# Patient Record
Sex: Female | Born: 1960 | Race: White | Hispanic: No | Marital: Married | Smoking: Current every day smoker
Health system: Southern US, Community
[De-identification: ages and names within clinical notes are randomized; demographics above are authoritative.]

## PROBLEM LIST (undated history)

## (undated) DIAGNOSIS — M5136 Other intervertebral disc degeneration, lumbar region: Secondary | ICD-10-CM

## (undated) DIAGNOSIS — E785 Hyperlipidemia, unspecified: Secondary | ICD-10-CM

## (undated) DIAGNOSIS — M51369 Other intervertebral disc degeneration, lumbar region without mention of lumbar back pain or lower extremity pain: Secondary | ICD-10-CM

## (undated) DIAGNOSIS — R7303 Prediabetes: Secondary | ICD-10-CM

## (undated) DIAGNOSIS — E876 Hypokalemia: Secondary | ICD-10-CM

## (undated) DIAGNOSIS — I10 Essential (primary) hypertension: Secondary | ICD-10-CM

## (undated) DIAGNOSIS — Q809 Congenital ichthyosis, unspecified: Secondary | ICD-10-CM

## (undated) DIAGNOSIS — G4700 Insomnia, unspecified: Secondary | ICD-10-CM

## (undated) DIAGNOSIS — C4491 Basal cell carcinoma of skin, unspecified: Secondary | ICD-10-CM

## (undated) HISTORY — DX: Congenital ichthyosis, unspecified: Q80.9

## (undated) HISTORY — DX: Other intervertebral disc degeneration, lumbar region: M51.36

## (undated) HISTORY — DX: Insomnia, unspecified: G47.00

## (undated) HISTORY — PX: BREAST SURGERY: SHX581

## (undated) HISTORY — DX: Hypokalemia: E87.6

## (undated) HISTORY — DX: Prediabetes: R73.03

## (undated) HISTORY — DX: Basal cell carcinoma of skin, unspecified: C44.91

## (undated) HISTORY — PX: ABDOMINAL HYSTERECTOMY: SHX81

## (undated) HISTORY — DX: Other intervertebral disc degeneration, lumbar region without mention of lumbar back pain or lower extremity pain: M51.369

## (undated) HISTORY — PX: CHOLECYSTECTOMY: SHX55

## (undated) HISTORY — DX: Essential (primary) hypertension: I10

## (undated) HISTORY — DX: Hyperlipidemia, unspecified: E78.5

---

## 2003-12-12 ENCOUNTER — Ambulatory Visit: Payer: Self-pay | Admitting: Surgery

## 2004-04-28 ENCOUNTER — Emergency Department: Payer: Self-pay | Admitting: Internal Medicine

## 2004-05-28 ENCOUNTER — Ambulatory Visit: Payer: Self-pay | Admitting: Surgery

## 2004-10-31 ENCOUNTER — Inpatient Hospital Stay (HOSPITAL_COMMUNITY): Admission: RE | Admit: 2004-10-31 | Discharge: 2004-11-01 | Payer: Self-pay | Admitting: Gynecology

## 2005-03-09 HISTORY — PX: BREAST EXCISIONAL BIOPSY: SUR124

## 2006-01-21 ENCOUNTER — Ambulatory Visit: Payer: Self-pay | Admitting: Surgery

## 2006-02-02 ENCOUNTER — Ambulatory Visit: Payer: Self-pay | Admitting: Internal Medicine

## 2006-04-21 ENCOUNTER — Ambulatory Visit: Payer: Self-pay | Admitting: Internal Medicine

## 2006-05-18 ENCOUNTER — Ambulatory Visit: Payer: Self-pay | Admitting: Surgery

## 2006-05-24 ENCOUNTER — Ambulatory Visit: Payer: Self-pay | Admitting: Surgery

## 2007-01-26 ENCOUNTER — Ambulatory Visit: Payer: Self-pay | Admitting: Surgery

## 2007-10-05 ENCOUNTER — Ambulatory Visit: Payer: Self-pay | Admitting: Internal Medicine

## 2008-01-30 ENCOUNTER — Ambulatory Visit: Payer: Self-pay | Admitting: Internal Medicine

## 2009-05-23 ENCOUNTER — Ambulatory Visit: Payer: Self-pay | Admitting: Internal Medicine

## 2010-05-27 ENCOUNTER — Ambulatory Visit: Payer: Self-pay | Admitting: Internal Medicine

## 2010-12-26 ENCOUNTER — Other Ambulatory Visit (INDEPENDENT_AMBULATORY_CARE_PROVIDER_SITE_OTHER): Payer: Self-pay

## 2010-12-26 VITALS — BP 128/76 | HR 87 | Ht 66.0 in | Wt 236.0 lb

## 2010-12-26 DIAGNOSIS — Z111 Encounter for screening for respiratory tuberculosis: Secondary | ICD-10-CM

## 2010-12-26 NOTE — Progress Notes (Signed)
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  Her current primary care is unable to get her in for several weeks. TB placed in R forearm.

## 2011-05-28 ENCOUNTER — Ambulatory Visit: Payer: Self-pay | Admitting: Internal Medicine

## 2012-06-28 ENCOUNTER — Ambulatory Visit: Payer: Self-pay | Admitting: Internal Medicine

## 2013-08-30 ENCOUNTER — Ambulatory Visit: Payer: Self-pay | Admitting: Family Medicine

## 2014-07-05 ENCOUNTER — Other Ambulatory Visit: Payer: Self-pay | Admitting: Family Medicine

## 2014-07-05 DIAGNOSIS — Z1231 Encounter for screening mammogram for malignant neoplasm of breast: Secondary | ICD-10-CM

## 2014-09-04 ENCOUNTER — Ambulatory Visit
Admission: RE | Admit: 2014-09-04 | Discharge: 2014-09-04 | Disposition: A | Discharge status: Home / Self Care | Payer: Self-pay | Source: Ambulatory Visit | Attending: Family Medicine | Admitting: Family Medicine

## 2014-09-04 DIAGNOSIS — Z1231 Encounter for screening mammogram for malignant neoplasm of breast: Secondary | ICD-10-CM

## 2016-10-20 ENCOUNTER — Other Ambulatory Visit: Payer: Self-pay | Admitting: Family Medicine

## 2016-10-20 DIAGNOSIS — Z1231 Encounter for screening mammogram for malignant neoplasm of breast: Secondary | ICD-10-CM

## 2016-11-02 ENCOUNTER — Ambulatory Visit: Payer: Self-pay

## 2016-11-10 ENCOUNTER — Ambulatory Visit
Admission: RE | Admit: 2016-11-10 | Discharge: 2016-11-10 | Disposition: A | Discharge status: Home / Self Care | Payer: 59 | Source: Ambulatory Visit | Attending: Family Medicine | Admitting: Family Medicine

## 2016-11-10 DIAGNOSIS — Z1231 Encounter for screening mammogram for malignant neoplasm of breast: Secondary | ICD-10-CM | POA: Insufficient documentation

## 2017-09-14 ENCOUNTER — Emergency Department
Admission: EM | Admit: 2017-09-14 | Discharge: 2017-09-14 | Disposition: A | Discharge status: Home / Self Care | Payer: 59 | Attending: Emergency Medicine | Admitting: Emergency Medicine

## 2017-09-14 ENCOUNTER — Emergency Department: Payer: 59

## 2017-09-14 ENCOUNTER — Encounter: Payer: Self-pay | Admitting: Emergency Medicine

## 2017-09-14 ENCOUNTER — Other Ambulatory Visit: Payer: Self-pay

## 2017-09-14 DIAGNOSIS — R220 Localized swelling, mass and lump, head: Secondary | ICD-10-CM | POA: Diagnosis present

## 2017-09-14 DIAGNOSIS — K047 Periapical abscess without sinus: Secondary | ICD-10-CM

## 2017-09-14 DIAGNOSIS — F17200 Nicotine dependence, unspecified, uncomplicated: Secondary | ICD-10-CM | POA: Insufficient documentation

## 2017-09-14 LAB — CBC WITH DIFFERENTIAL/PLATELET
BASOS ABS: 0 10*3/uL (ref 0–0.1)
BASOS PCT: 0 %
Eosinophils Absolute: 0.1 10*3/uL (ref 0–0.7)
Eosinophils Relative: 1 %
HEMATOCRIT: 42.8 % (ref 35.0–47.0)
HEMOGLOBIN: 15 g/dL (ref 12.0–16.0)
Lymphocytes Relative: 22 %
Lymphs Abs: 2.7 10*3/uL (ref 1.0–3.6)
MCH: 33.2 pg (ref 26.0–34.0)
MCHC: 35.1 g/dL (ref 32.0–36.0)
MCV: 94.8 fL (ref 80.0–100.0)
MONOS PCT: 6 %
Monocytes Absolute: 0.7 10*3/uL (ref 0.2–0.9)
NEUTROS ABS: 8.9 10*3/uL — AB (ref 1.4–6.5)
NEUTROS PCT: 71 %
Platelets: 186 10*3/uL (ref 150–440)
RBC: 4.51 MIL/uL (ref 3.80–5.20)
RDW: 13 % (ref 11.5–14.5)
WBC: 12.4 10*3/uL — ABNORMAL HIGH (ref 3.6–11.0)

## 2017-09-14 LAB — BASIC METABOLIC PANEL
ANION GAP: 9 (ref 5–15)
BUN: 10 mg/dL (ref 6–20)
CHLORIDE: 105 mmol/L (ref 98–111)
CO2: 25 mmol/L (ref 22–32)
Calcium: 9 mg/dL (ref 8.9–10.3)
Creatinine, Ser: 0.88 mg/dL (ref 0.44–1.00)
GFR calc non Af Amer: >60 mL/min (ref >60)
Glucose, Bld: 128 mg/dL — ABNORMAL HIGH (ref 70–99)
Potassium: 3.7 mmol/L (ref 3.5–5.1)
Sodium: 139 mmol/L (ref 135–145)

## 2017-09-14 MED ORDER — CLINDAMYCIN PHOSPHATE 600 MG/50ML IV SOLN
600.0000 mg | Freq: Once | INTRAVENOUS | Status: AC
  Administered 2017-09-14: 600 mg via INTRAVENOUS
  Filled 2017-09-14: qty 50

## 2017-09-14 MED ORDER — IOHEXOL 300 MG/ML  SOLN
75.0000 mL | Freq: Once | INTRAMUSCULAR | Status: AC | PRN
  Administered 2017-09-14: 75 mL via INTRAVENOUS
  Filled 2017-09-14: qty 75

## 2017-09-14 MED ORDER — HYDROCODONE-ACETAMINOPHEN 5-325 MG PO TABS
1.0000 | ORAL_TABLET | Freq: Once | ORAL | Status: AC
  Administered 2017-09-14: 1 via ORAL
  Filled 2017-09-14: qty 1

## 2017-09-14 MED ORDER — KETOROLAC TROMETHAMINE 10 MG PO TABS: 10.0000 mg | ORAL_TABLET | Freq: Four times a day (QID) | ORAL | 0 refills | Status: DC | PRN

## 2017-09-14 MED ORDER — TRAMADOL HCL 50 MG PO TABS: 50.0000 mg | ORAL_TABLET | Freq: Four times a day (QID) | ORAL | 0 refills | Status: AC | PRN

## 2017-09-14 MED ORDER — CLINDAMYCIN HCL 300 MG PO CAPS: 300.0000 mg | ORAL_CAPSULE | Freq: Three times a day (TID) | ORAL | 0 refills | Status: AC

## 2017-09-14 MED ORDER — KETOROLAC TROMETHAMINE 30 MG/ML IJ SOLN
30.0000 mg | Freq: Once | INTRAMUSCULAR | Status: AC
  Administered 2017-09-14: 30 mg via INTRAVENOUS
  Filled 2017-09-14: qty 1

## 2017-09-14 NOTE — Discharge Instructions (Signed)
Follow-up by staff and care from list of dental clinics provided. OPTIONS FOR DENTAL FOLLOW UP CARE  [facility] [place] [email or web address] [phone])  [facility] [phone])  [facility] [phone])  [place] [phone])  [facility] [phone]) This clinic caters to the indigent population and is on a lottery system. Location: [employer] [facility], [place], [place], [place] [facility] Hours: Wednesdays from 6pm - 9pm, patients seen by a lottery system. For dates, call or go to [email or web address] Services: Cleanings, fillings and simple extractions. Payment Options: DENTAL WORK IS FREE OF CHARGE. Bring proof of income or support. Best way to get seen: Arrive at 5:15 pm - this is a lottery, NOT first come/first serve, so arriving earlier will not increase your chances of being seen.     [facility] [phone] Select option 1 for emergencies   Location: [facility], [place], [address], [place] Clinic Hours: No walk-ins accepted - call the day before to schedule an appointment. Check in times are 9:30 am and 1:30 pm. Services: Simple extractions, temporary fillings, pulpectomy/pulp debridement, uncomplicated abscess drainage. Payment Options: PAYMENT IS DUE AT THE TIME OF SERVICE.  Fee is usually $100-200, additional surgical procedures (e.g. abscess drainage) may be extra. Cash, checks, Visa/MasterCard accepted.  Can file Medicaid if patient is covered for dental - patient should call case worker to check. No discount for [facility] patients. Best way to get seen: MUST call the day before and get onto the schedule. Can usually be seen the next 1-2 days. No walk-ins  accepted.     [place] [phone]   Location: [place], [place] [facility] Hours: M, W, Th, F 8am or 1:30pm, Tues 9a or 1:30 - first come/first served. Services: Simple extractions, temporary fillings, uncomplicated abscess drainage.  You do not need to be an [facility] resident. Payment Options: PAYMENT IS DUE AT THE TIME OF SERVICE. Dental insurance, otherwise sliding scale - bring proof of income or support. Depending on income and treatment needed, cost is usually $50-200. Best way to get seen: Arrive early as it is first come/first served.     [facility] [phone]   Location: [facility] Hours: Mon-Thu 8a-5p Services: Most basic dental services including extractions and fillings. Payment Options: PAYMENT IS DUE AT THE TIME OF SERVICE. Sliding scale, up to 50% off - bring proof if income or support. Medicaid with dental option accepted. Best way to get seen: Call to schedule an appointment, can usually be seen within 2 weeks OR they will try to see walk-ins - show up at [place] or 2p (you may have to wait).     [facility] [place] RESIDENTS ONLY   Location: [facility], [place] [address], [place], [place] [address] Clinic Hours: By appointment only. Monday - Thursday 8am-5pm, Friday 8am-12pm Services: Cleanings, fillings, extractions. Payment Options: PAYMENT IS DUE AT THE TIME OF SERVICE. Cash, Visa or MasterCard. Sliding scale - $30 minimum per service. Best way to get seen: Come in to office, complete packet and make an appointment - need proof of income or support monies for each household member and proof of [facility] residence. Usually takes about a month to get in.     [facility] [phone]   Location: [address], [facility] Hours: Walk-in  Urgent Care Dental Services are offered Monday-Friday mornings only. The numbers of emergencies accepted daily is limited to the number of providers available. Maximum 15 - Mondays, Wednesdays & Thursdays Maximum 10 - Tuesdays & Fridays Services: You do not need to be a [facility] resident to be seen for a dental emergency. Emergencies are defined as pain, swelling, abnormal bleeding, or dental trauma. Walkins will receive x-rays if needed. NOTE: Dental cleaning is not an emergency. Payment Options: PAYMENT IS DUE AT THE TIME OF SERVICE. Minimum co-pay is $40.00 for uninsured patients. Minimum co-pay is $3.00 for Medicaid with dental coverage. Dental Insurance is accepted and must be presented at time of visit. Medicare does not cover dental. Forms of payment: Cash, credit card, checks. Best way to get seen: If not previously registered with the clinic, walk-in dental registration begins at 7:15 am and is on a first come/first serve basis. If previously registered with the clinic, call to make an appointment.     [facility] [place] ONLY   Location: [address], [place], [place] Clinic Hours: Mon-Thu 10a-2p Services: Extractions only! Payment Options: FREE (donations accepted) - bring proof of income or support Best way to get seen: Call and schedule an appointment OR come at 8am on the 1st Monday of every month (except for holidays) when it is first come/first served.     [facility] [phone]   Location: [place], [place] Clinic Hours: Friday mornings Services, Payment Options, Best way to get seen: Call for info

## 2017-09-14 NOTE — ED Provider Notes (Signed)
[facility] Emergency Department Provider Note   ____________________________________________   First MD Initiated Contact with Patient 09/14/17 [phone]     (approximate)  I have reviewed the triage vital signs and the nursing notes.   HISTORY  Chief Complaint Oral Swelling    HPI Amanda Fitzgerald is a 57 y.o. female patient presents with left lateral mandible swelling.  Patient on state of complaint was last night.  Patient states she has a fracture to which occurred 3 to 4 months ago.  Patient states pain has increased with the edema.  Patient rates pain as 8/10.  No palliative measures for complaint.  Patient do not have a dentist.  History reviewed. No pertinent past medical history.  There are no active problems to display for this patient.   Past Surgical History:  Procedure Laterality Date  . ABDOMINAL HYSTERECTOMY    . BREAST SURGERY      Prior to Admission medications   Medication Sig Start Date End Date Taking? Authorizing Provider  clindamycin (CLEOCIN) 300 MG capsule Take 1 capsule (300 mg total) by mouth 3 (three) times daily for 10 days. 09/14/17 09/24/17  Sable Feil, PA-C  ketorolac (TORADOL) 10 MG tablet Take 1 tablet (10 mg total) by mouth every 6 (six) hours as needed. 09/14/17   Sable Feil, PA-C  traMADol (ULTRAM) 50 MG tablet Take 1 tablet (50 mg total) by mouth every 6 (six) hours as needed. 09/14/17 09/14/18  Sable Feil, PA-C    Allergies Dilaudid [hydromorphone hcl]  Family History  Problem Relation Age of Onset  . Breast cancer Maternal Aunt 63    Social History Social History   Tobacco Use  . Smoking status: Current Every Day Smoker  Substance Use Topics  . Alcohol use: No  . Drug use: No    Review of Systems Constitutional: No fever/chills Eyes: No visual changes. ENT: No sore throat.  Dental pain Cardiovascular: Denies chest pain. Respiratory: Denies shortness of breath. Gastrointestinal: No  abdominal pain.  No nausea, no vomiting.  No diarrhea.  No constipation. Genitourinary: Negative for dysuria. Musculoskeletal: Negative for back pain. Skin: Negative for rash. Neurological: Negative for headaches, focal weakness or numbness. Allergic/Immunilogical: Dilaudid ____________________________________________   PHYSICAL EXAM:  VITAL SIGNS: ED Triage Vitals  Enc Vitals Group     BP 09/14/17 0852 (!) 184/82     Pulse Rate 09/14/17 0852 100     Resp 09/14/17 0852 20     Temp 09/14/17 0852 98.4 F (36.9 C)     Temp Source 09/14/17 0852 Oral     SpO2 09/14/17 0852 96 %     Weight 09/14/17 0848 235 lb (106.6 kg)     Height 09/14/17 0848 5\' 5"  (1.651 m)     Head Circumference --      Peak Flow --      Pain Score 09/14/17 0848 6     Pain Loc --      Pain Edu? --      Excl. in [place]? --    Constitutional: Alert and oriented. Well appearing and in no acute distress. Mouth/Throat: Mucous membranes are moist.  Oropharynx non-erythematous.  Edematous gingiva and fractured tooth #19. Neck: No stridor. Hematological/Lymphatic/Immunilogical: No cervical lymphadenopathy. Cardiovascular: Normal rate, regular rhythm. Grossly normal heart sounds.  Good peripheral circulation.   Respiratory: Normal respiratory effort.  No retractions. Lungs CTAB. Musculoskeletal: No lower extremity tenderness nor edema.  No joint effusions. Neurologic:  Normal speech and language.  No gross focal neurologic deficits are appreciated. No gait instability. Skin:  Skin is warm, dry and intact. No rash noted.  Facial edema left lateral mandible area Psychiatric: Mood and affect are normal. Speech and behavior are normal.  ____________________________________________   LABS (all labs ordered are listed, but only abnormal results are displayed)  Labs Reviewed  CBC WITH DIFFERENTIAL/PLATELET - Abnormal; Notable for the following components:      Result Value   WBC 12.4 (*)    Neutro Abs 8.9 (*)    All other  components within normal limits  BASIC METABOLIC PANEL - Abnormal; Notable for the following components:   Glucose, Bld 128 (*)    All other components within normal limits   ____________________________________________  EKG   ____________________________________________  RADIOLOGY  ED MD interpretation:    Official radiology report(s): Ct Maxillofacial W Contrast  Result Date: 09/14/2017 CLINICAL DATA:  57 year old female with abscessed tooth and left facial swelling. EXAM: CT MAXILLOFACIAL WITH CONTRAST TECHNIQUE: Multidetector CT imaging of the maxillofacial structures was performed with intravenous contrast. Multiplanar CT image reconstructions were also generated. CONTRAST:  89mL OMNIPAQUE IOHEXOL 300 MG/ML  SOLN COMPARISON:  None. FINDINGS: Osseous: The mandible, maxilla, and zygoma are intact. Chronic appearing mild right nasal bone fracture. Intact skull base and visible calvarium. Intact visible cervical spine. Carious bilateral posterior mandible and maxillary dentition. On the left side prominent dental caries are noted at the residual left mandible molar along with infectious periapical lucency at the roots, more so the posterior root seen on series 3, image 70. Additional prominent dental caries at the residual left mandible wisdom tooth, residual left maxillary molar, residual right mandible molar and right maxillary anterior bicuspid. Orbits: Intact orbital walls.  Normal bilateral orbits soft tissues. Sinuses: Well pneumatized. There is mild mucosal thickening in a right posterior ethmoid air cell. The tympanic cavities and mastoids are clear. Nasal cavity well pneumatized. Soft tissues: Generalized soft tissue swelling, thickening, and stranding about the left buccal space as seen on coronal image 36 of series 6. This includes confluent soft tissue stranding along the body of the left mandible, but no subperiosteal abscess is identified. Associated thickening of the left platysma.  Trace fluid tracking along the left platysma superficial to the left submandibular space, but no organized or drainable fluid collection. The inferior left masticator space is involved, but the sublingual and left parotid spaces are spared. Mild reactive left level 1 B and level 2 lymph nodes are noted with no cystic or necrotic nodes. Negative visible larynx, pharynx, parapharyngeal spaces, retropharyngeal space (retropharyngeal course of both carotids, more so the right), right submandibular space and right parotid space. The major vascular structures in the neck and at the skull base are patent, including the cavernous sinus. Limited intracranial: Negative visualized brain parenchyma. IMPRESSION: 1. Left buccal space and facial cellulitis which is probably odontogenic in origin. However, there is no subperiosteal or soft tissue abscess, and no obvious tooth of origin - although the carious left mandible molar is favored. 2. Reactive left cervical lymph nodes. Electronically Signed   By: Genevie Ann M.D.   On: 09/14/2017 11:23    ____________________________________________   PROCEDURES  Procedure(s) performed: None  Procedures  Critical Care performed: No  ____________________________________________   INITIAL IMPRESSION / ASSESSMENT AND PLAN / ED COURSE  As part of my medical decision making, I reviewed the following data within the electronic MEDICAL RECORD NUMBER    Left facial edema secondary to dontogenic cellulitis.  Discussed  rationale for patient to follow-up with dentist for definitive evaluation and treatment.  Patient given discharge care instruction.  Patient given a prescription for clindamycin, Toradol, and tramadol.      ____________________________________________   FINAL CLINICAL IMPRESSION(S) / ED DIAGNOSES  Final diagnoses:  Dental abscess     ED Discharge Orders        Ordered    clindamycin (CLEOCIN) 300 MG capsule  3 times daily     09/14/17 1138    ketorolac  (TORADOL) 10 MG tablet  Every 6 hours PRN     09/14/17 1138    traMADol (ULTRAM) 50 MG tablet  Every 6 hours PRN     09/14/17 1138       Note:  This document was prepared using Dragon voice recognition software and may include unintentional dictation errors.    Sable Feil, PA-C 09/14/17 1153    Lavonia Drafts, MD 09/14/17 [phone]

## 2017-09-14 NOTE — ED Triage Notes (Signed)
Patient complaining of "abscessed tooth".  Swelling left side of face, states pain started with lower jaw tooth, "part of the tooth broke off months ago".

## 2018-10-14 IMAGING — CT CT MAXILLOFACIAL W/ CM
3 series · 14 of 47 positions shown, 16 images · IV contrast (omnipaque)
Comparison: None.

CLINICAL DATA: 57-year-old female with abscessed tooth and left
facial swelling.

EXAM:
CT MAXILLOFACIAL WITH CONTRAST
TECHNIQUE: Multidetector CT imaging of the maxillofacial structures was
performed with intravenous contrast. Multiplanar CT image
reconstructions were also generated.
CONTRAST:  75mL OMNIPAQUE IOHEXOL 300 MG/ML  SOLN

[Series 2: max soft · axial · 0.36mm/px · z∈[-116,+38]mm · 8 of 91 slices shown, 10 images]
[im 7/91  brain]
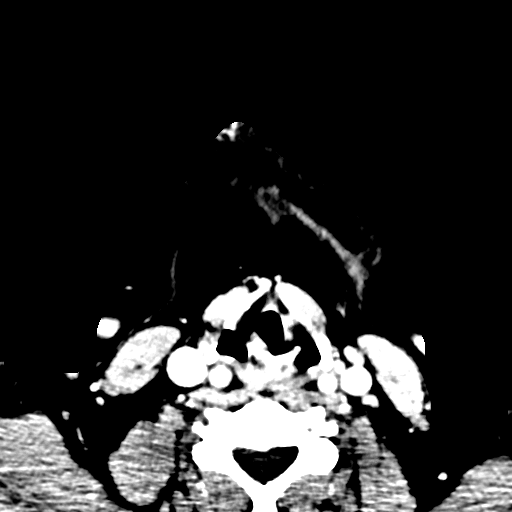
[im 7/91  bone]
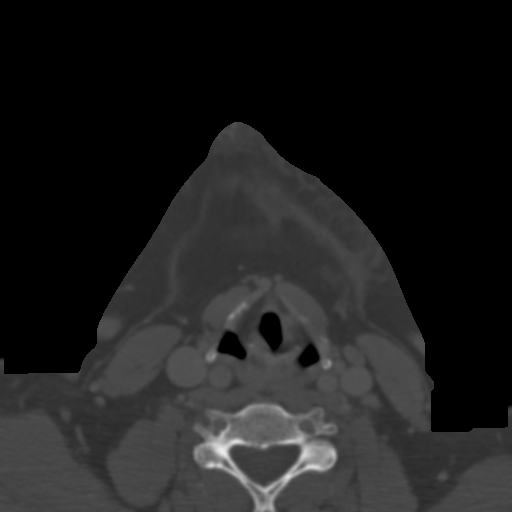
[im 19/91  bone]
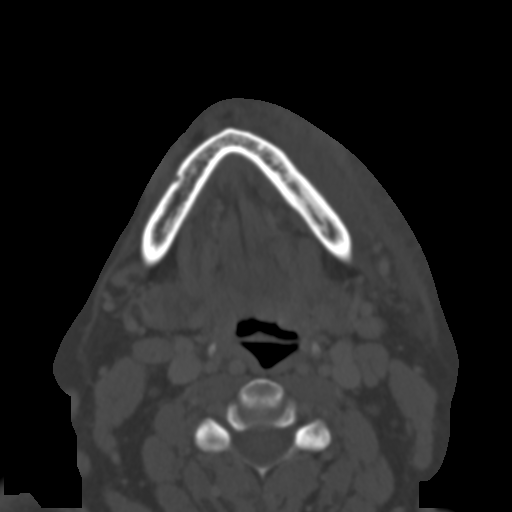
[im 28/91  bone]
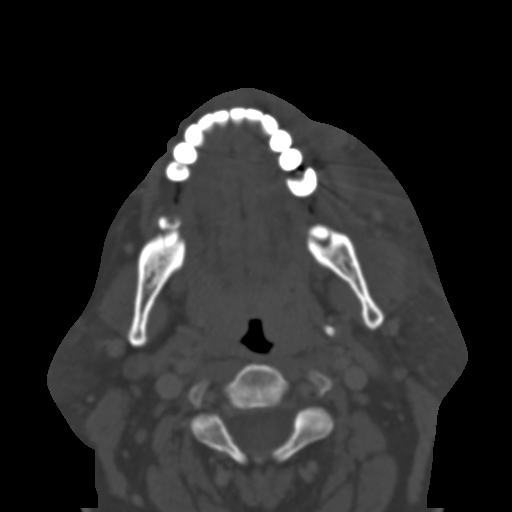
[im 41/91  bone]
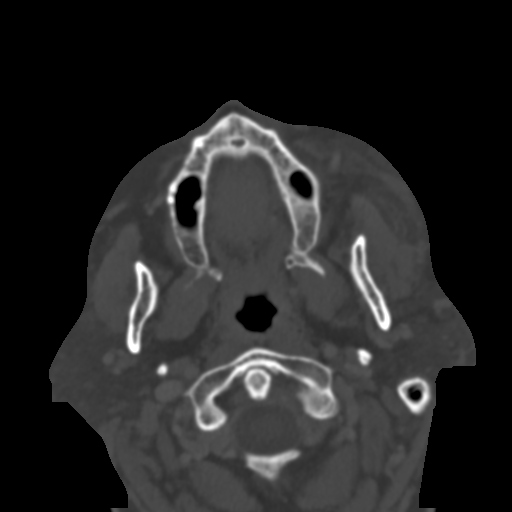
[im 50/91  brain]
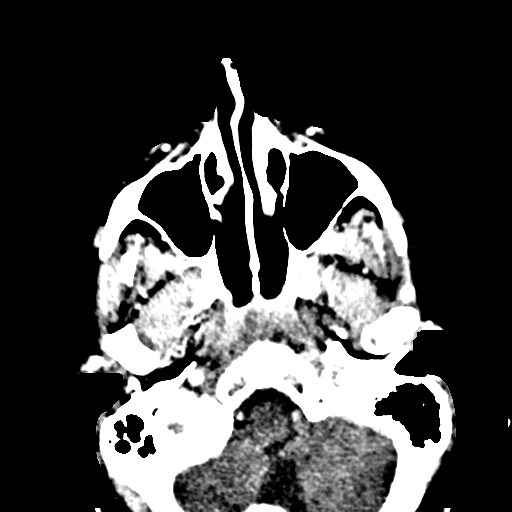
[im 50/91  bone]
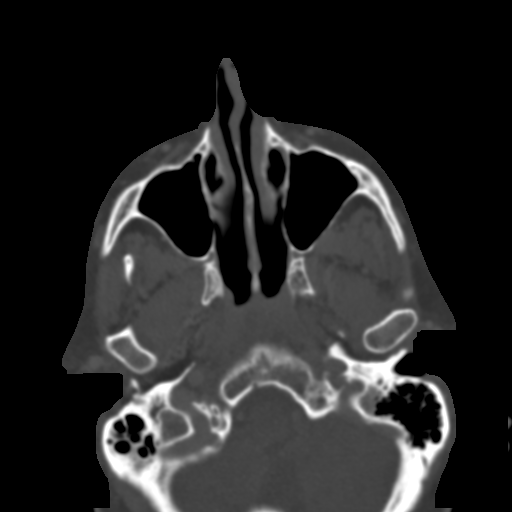
[im 63/91  bone]
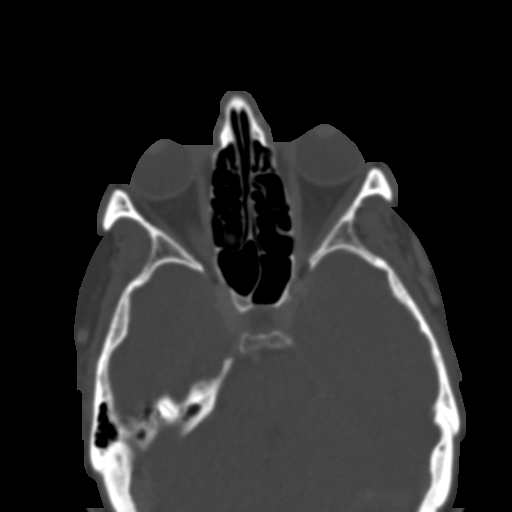
[im 72/91  bone]
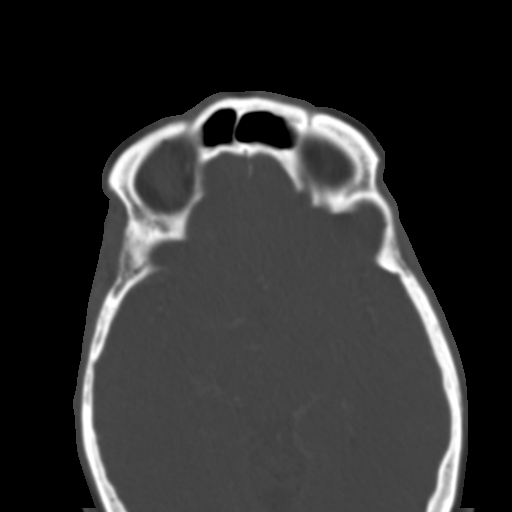
[im 84/91  bone]
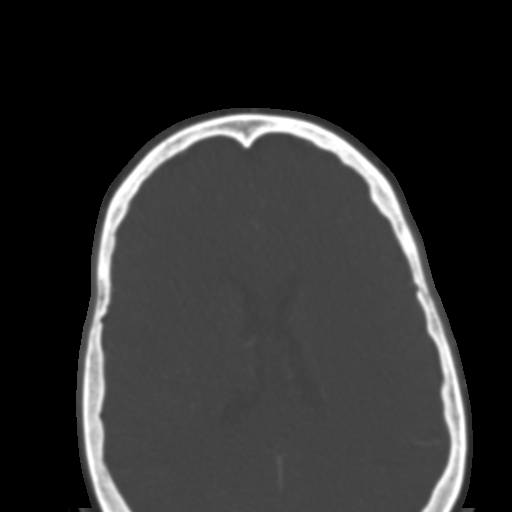

[Series 6: coronal soft · coronal · 0.36mm/px · 3 of 114 slices shown]
[im 38/114  bone]
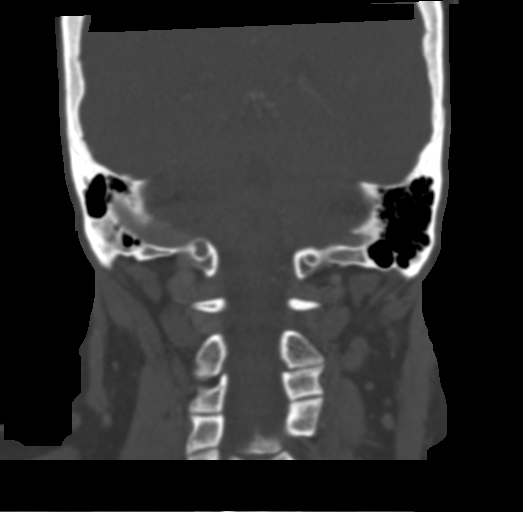
[im 51/114  bone]
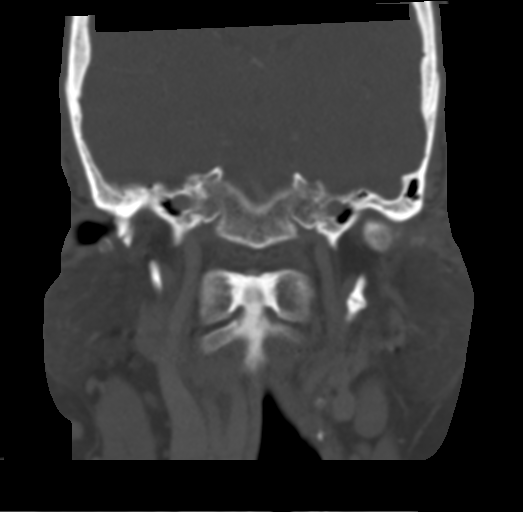
[im 63/114  bone]
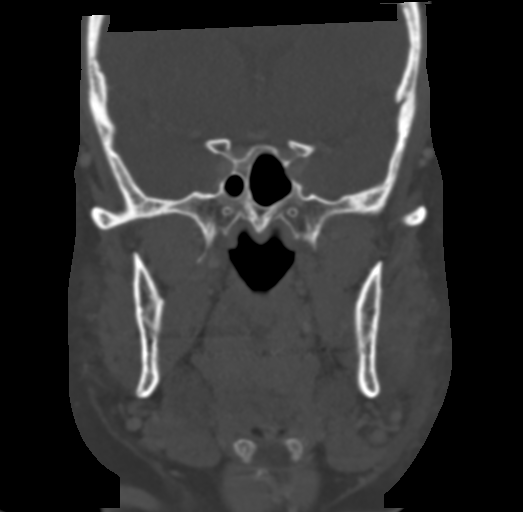

[Series 8: sagittal soft · sagittal · 0.36mm/px · 3 of 92 slices shown]
[im 31/92  bone]
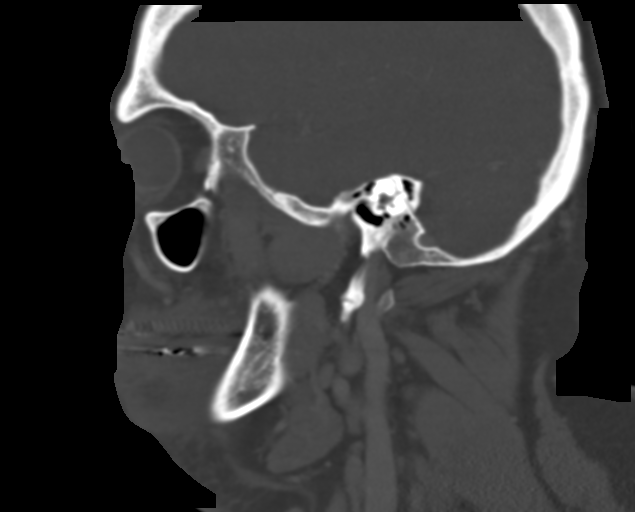
[im 46/92  bone]
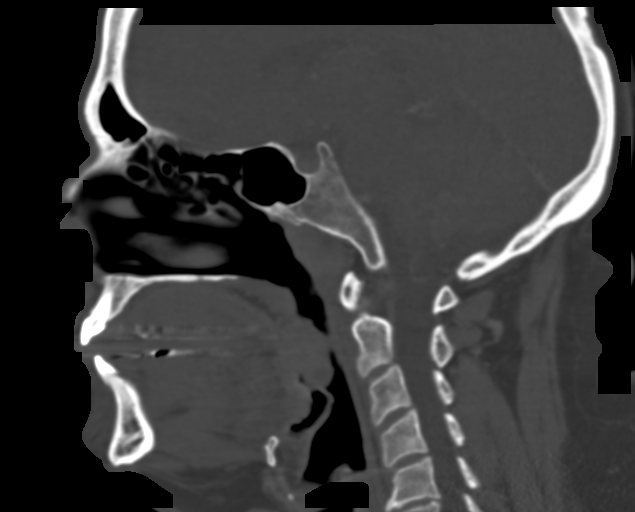
[im 61/92  bone]
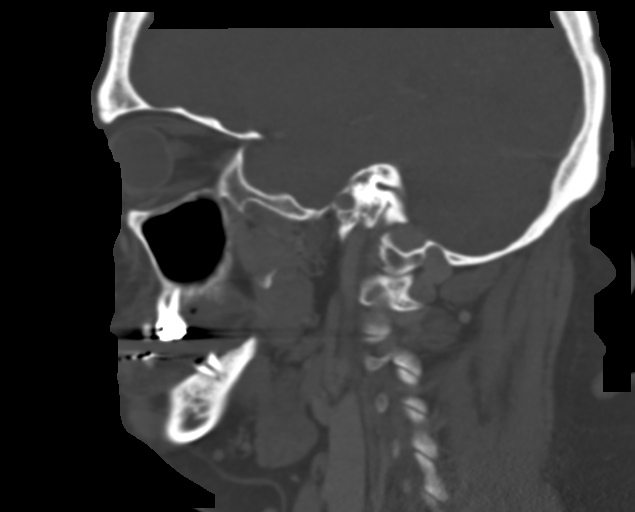

[14 of 47 positions shown; findings below may reference images not displayed]

FINDINGS: Osseous: The mandible, maxilla, and zygoma are intact. Chronic
appearing mild right nasal bone fracture. Intact skull base and
visible calvarium. Intact visible cervical spine.

Carious bilateral posterior mandible and maxillary dentition. On the
left side prominent dental caries are noted at the residual left
mandible molar along with infectious periapical lucency at the
roots, more so the posterior root seen on series 3, image 70.

Additional prominent dental caries at the residual left mandible
wisdom tooth, residual left maxillary molar, residual right mandible
molar and right maxillary anterior bicuspid.

Orbits: Intact orbital walls.  Normal bilateral orbits soft tissues.

Sinuses: Well pneumatized. There is mild mucosal thickening in a
right posterior ethmoid air cell. The tympanic cavities and mastoids
are clear. Nasal cavity well pneumatized.

Soft tissues:

Generalized soft tissue swelling, thickening, and stranding about
the left buccal space as seen on coronal image 36 of series 6. This
includes confluent soft tissue stranding along the body of the left
mandible, but no subperiosteal abscess is identified. Associated
thickening of the left platysma. Trace fluid tracking along the left
platysma superficial to the left submandibular space, but no
organized or drainable fluid collection. The inferior left
masticator space is involved, but the sublingual and left parotid
spaces are spared.

Mild reactive left level 1 B and level 2 lymph nodes are noted with
no cystic or necrotic nodes.

Negative visible larynx, pharynx, parapharyngeal spaces,
retropharyngeal space (retropharyngeal course of both carotids, more
so the right), right submandibular space and right parotid space.
The major vascular structures in the neck and at the skull base are
patent, including the cavernous sinus.

Limited intracranial: Negative visualized brain parenchyma.
IMPRESSION: 1. Left buccal space and facial cellulitis which is probably
odontogenic in origin. However, there is no subperiosteal or soft
tissue abscess, and no obvious tooth of origin - although the
carious left mandible molar is favored.
2. Reactive left cervical lymph nodes.

## 2019-06-02 ENCOUNTER — Other Ambulatory Visit: Payer: Self-pay

## 2019-06-02 ENCOUNTER — Emergency Department
Admission: EM | Admit: 2019-06-02 | Discharge: 2019-06-02 | Disposition: A | Discharge status: Home / Self Care | Payer: Self-pay | Attending: Emergency Medicine | Admitting: Emergency Medicine

## 2019-06-02 ENCOUNTER — Encounter: Payer: Self-pay | Admitting: Emergency Medicine

## 2019-06-02 ENCOUNTER — Emergency Department: Payer: Self-pay

## 2019-06-02 DIAGNOSIS — Y990 Civilian activity done for income or pay: Secondary | ICD-10-CM | POA: Insufficient documentation

## 2019-06-02 DIAGNOSIS — Y9259 Other trade areas as the place of occurrence of the external cause: Secondary | ICD-10-CM | POA: Insufficient documentation

## 2019-06-02 DIAGNOSIS — S39012A Strain of muscle, fascia and tendon of lower back, initial encounter: Secondary | ICD-10-CM | POA: Insufficient documentation

## 2019-06-02 DIAGNOSIS — Y939 Activity, unspecified: Secondary | ICD-10-CM | POA: Insufficient documentation

## 2019-06-02 DIAGNOSIS — F17210 Nicotine dependence, cigarettes, uncomplicated: Secondary | ICD-10-CM | POA: Insufficient documentation

## 2019-06-02 DIAGNOSIS — X500XXA Overexertion from strenuous movement or load, initial encounter: Secondary | ICD-10-CM | POA: Insufficient documentation

## 2019-06-02 LAB — URINALYSIS, COMPLETE (UACMP) WITH MICROSCOPIC
Bacteria, UA: NONE SEEN
Bilirubin Urine: NEGATIVE
Glucose, UA: NEGATIVE mg/dL
Hgb urine dipstick: NEGATIVE
Ketones, ur: NEGATIVE mg/dL
Leukocytes,Ua: NEGATIVE
Nitrite: NEGATIVE
Protein, ur: NEGATIVE mg/dL
Specific Gravity, Urine: 1.011 (ref 1.005–1.030)
pH: 6 (ref 5.0–8.0)

## 2019-06-02 MED ORDER — TRAMADOL HCL 50 MG PO TABS: 50.0000 mg | ORAL_TABLET | Freq: Four times a day (QID) | ORAL | 0 refills | Status: DC | PRN

## 2019-06-02 MED ORDER — METHOCARBAMOL 750 MG PO TABS: 750.0000 mg | ORAL_TABLET | Freq: Four times a day (QID) | ORAL | 0 refills | Status: DC

## 2019-06-02 NOTE — ED Triage Notes (Signed)
Pt here for left lower back pain. Denies known injury.  Pain worse with any movement but especially bending.  No pain when sitting still.  No urinary sx at all per pt..  Ambulatory to triage. Lifts heavy items at work.

## 2019-06-02 NOTE — ED Provider Notes (Signed)
[facility] Emergency Department Provider Note   ____________________________________________   First MD Initiated Contact with Patient 06/02/19 1149     (approximate)  I have reviewed the triage vital signs and the nursing notes.   HISTORY  Chief Complaint Back Pain    HPI Amanda Fitzgerald is a 59 y.o. female patient complain of 4 days of increasing bilateral back pain.  Patient states no specific incident but her job does require repetitive heavy lifting.  Patient denies radicular component to her back pain.  Patient denies bladder or bowel dysfunction.  Patient rates the pain as 8/10.  Patient described pain is "achy".  No palliative measure for complaint.         History reviewed. No pertinent past medical history.  There are no problems to display for this patient.   Past Surgical History:  Procedure Laterality Date  . ABDOMINAL HYSTERECTOMY    . BREAST SURGERY      Prior to Admission medications   Medication Sig Start Date End Date Taking? Authorizing Provider  ketorolac (TORADOL) 10 MG tablet Take 1 tablet (10 mg total) by mouth every 6 (six) hours as needed. 09/14/17   Sable Feil, PA-C  methocarbamol (ROBAXIN-750) 750 MG tablet Take 1 tablet (750 mg total) by mouth 4 (four) times daily. 06/02/19   Sable Feil, PA-C  traMADol (ULTRAM) 50 MG tablet Take 1 tablet (50 mg total) by mouth every 6 (six) hours as needed for moderate pain. 06/02/19   Sable Feil, PA-C    Allergies Dilaudid [hydromorphone hcl]  Family History  Problem Relation Age of Onset  . Breast cancer Maternal Aunt 63    Social History Social History   Tobacco Use  . Smoking status: Current Every Day Smoker  . Smokeless tobacco: Never Used  Substance Use Topics  . Alcohol use: No  . Drug use: No    Review of Systems Constitutional: No fever/chills Eyes: No visual changes. ENT: No sore throat. Cardiovascular: Denies chest pain. Respiratory:  Denies shortness of breath. Gastrointestinal: No abdominal pain.  No nausea, no vomiting.  No diarrhea.  No constipation. Genitourinary: Negative for dysuria. Musculoskeletal: Positive for back pain. Skin: Negative for rash. Neurological: Negative for headaches, focal weakness or numbness. Endocrine:  Hypertension Allergic/Immunilogical: Dilaudid ____________________________________________   PHYSICAL EXAM:  VITAL SIGNS: ED Triage Vitals  Enc Vitals Group     BP 06/02/19 1129 (!) 140/58     Pulse Rate 06/02/19 1129 84     Resp 06/02/19 1129 18     Temp 06/02/19 1129 98.4 F (36.9 C)     Temp Source 06/02/19 1129 Oral     SpO2 06/02/19 1129 97 %     Weight 06/02/19 1139 230 lb (104.3 kg)     Height 06/02/19 1139 5\' 4"  (1.626 m)     Head Circumference --      Peak Flow --      Pain Score 06/02/19 1139 8     Pain Loc --      Pain Edu? --      Excl. in [place]? --    Constitutional: Alert and oriented. Well appearing and in no acute distress. Cardiovascular: Normal rate, regular rhythm. Grossly normal heart sounds.  Good peripheral circulation. Respiratory: Normal respiratory effort.  No retractions. Lungs CTAB. Gastrointestinal: Soft and nontender. No distention. No abdominal bruits.  Right CVA tenderness. Genitourinary: Deferred Musculoskeletal: No obvious lumbar spine deformity.  Patient is moderate guarding palpation of L3-L5.  Patient decreased range of motion with lateral movements.  Patient negative straight leg test.  Neurologic:  Normal speech and language. No gross focal neurologic deficits are appreciated. No gait instability. Skin:  Skin is warm, dry and intact. No rash noted. Psychiatric: Mood and affect are normal. Speech and behavior are normal.  ____________________________________________   LABS (all labs ordered are listed, but only abnormal results are displayed)  Labs Reviewed  URINALYSIS, COMPLETE (UACMP) WITH MICROSCOPIC - Abnormal; Notable for the  following components:      Result Value   Color, Urine YELLOW (*)    APPearance CLEAR (*)    All other components within normal limits   ____________________________________________  EKG   ____________________________________________  RADIOLOGY  ED MD interpretation:    Official radiology report(s): DG Lumbar Spine 2-3 Views  Result Date: 06/02/2019 CLINICAL DATA:  Low back pain for several days, no known injury, initial encounter EXAM: LUMBAR SPINE - 2-3 VIEW COMPARISON:  02/02/2006 FINDINGS: Five lumbar type vertebral bodies are well visualized. Vertebral body height is well maintained. Osteophytic changes are seen. No soft tissue abnormality is noted. Changes of prior cholecystectomy are seen. IMPRESSION: No acute abnormality noted.  Mild degenerative changes seen. Electronically Signed   By: Inez Catalina M.D.   On: 06/02/2019 12:47    ____________________________________________   PROCEDURES  Procedure(s) performed (including Critical Care):  Procedures   ____________________________________________   INITIAL IMPRESSION / ASSESSMENT AND PLAN / ED COURSE  As part of my medical decision making, I reviewed the following data within the [place]     Patient presents with bilateral lower back pain secondary to repetitive heavy lifting.  Physical exam consistent muscle strain.  Discussed negative findings of lumbar spine x-ray and urinalysis.  Patient given discharge care instruction work note.  Patient vies follow-up PCP in 3 days if no improvement.    Amanda Fitzgerald was evaluated in Emergency Department on 06/02/2019 for the symptoms described in the history of present illness. She was evaluated in the context of the global COVID-19 pandemic, which necessitated consideration that the patient might be at risk for infection with the SARS-CoV-2 virus that causes COVID-19. Institutional protocols and algorithms that pertain to the evaluation of patients at  risk for COVID-19 are in a state of rapid change based on information released by regulatory bodies including the CDC and federal and state organizations. These policies and algorithms were followed during the patient's care in the ED.       ____________________________________________   FINAL CLINICAL IMPRESSION(S) / ED DIAGNOSES  Final diagnoses:  Strain of lumbar region, initial encounter     ED Discharge Orders         Ordered    methocarbamol (ROBAXIN-750) 750 MG tablet  4 times daily,   Status:  Discontinued     06/02/19 1345    traMADol (ULTRAM) 50 MG tablet  Every 6 hours PRN,   Status:  Discontinued     06/02/19 1345    methocarbamol (ROBAXIN-750) 750 MG tablet  4 times daily     06/02/19 1346    traMADol (ULTRAM) 50 MG tablet  Every 6 hours PRN     06/02/19 1346           Note:  This document was prepared using Dragon voice recognition software and may include unintentional dictation errors.    Sable Feil, PA-C 06/02/19 1349    Blake Divine, MD 06/03/19 [phone]

## 2019-06-02 NOTE — Discharge Instructions (Signed)
Follow discharge care is take medication as directed.  Be advised medication may cause drowsiness.

## 2020-07-01 IMAGING — CR DG LUMBAR SPINE 2-3V
3 series · 3 of 3 positions shown · non-contrast
Comparison: 02/02/2006

CLINICAL DATA: Low back pain for several days, no known injury,
initial encounter

EXAM:
LUMBAR SPINE - 2-3 VIEW

[l-spine ap]
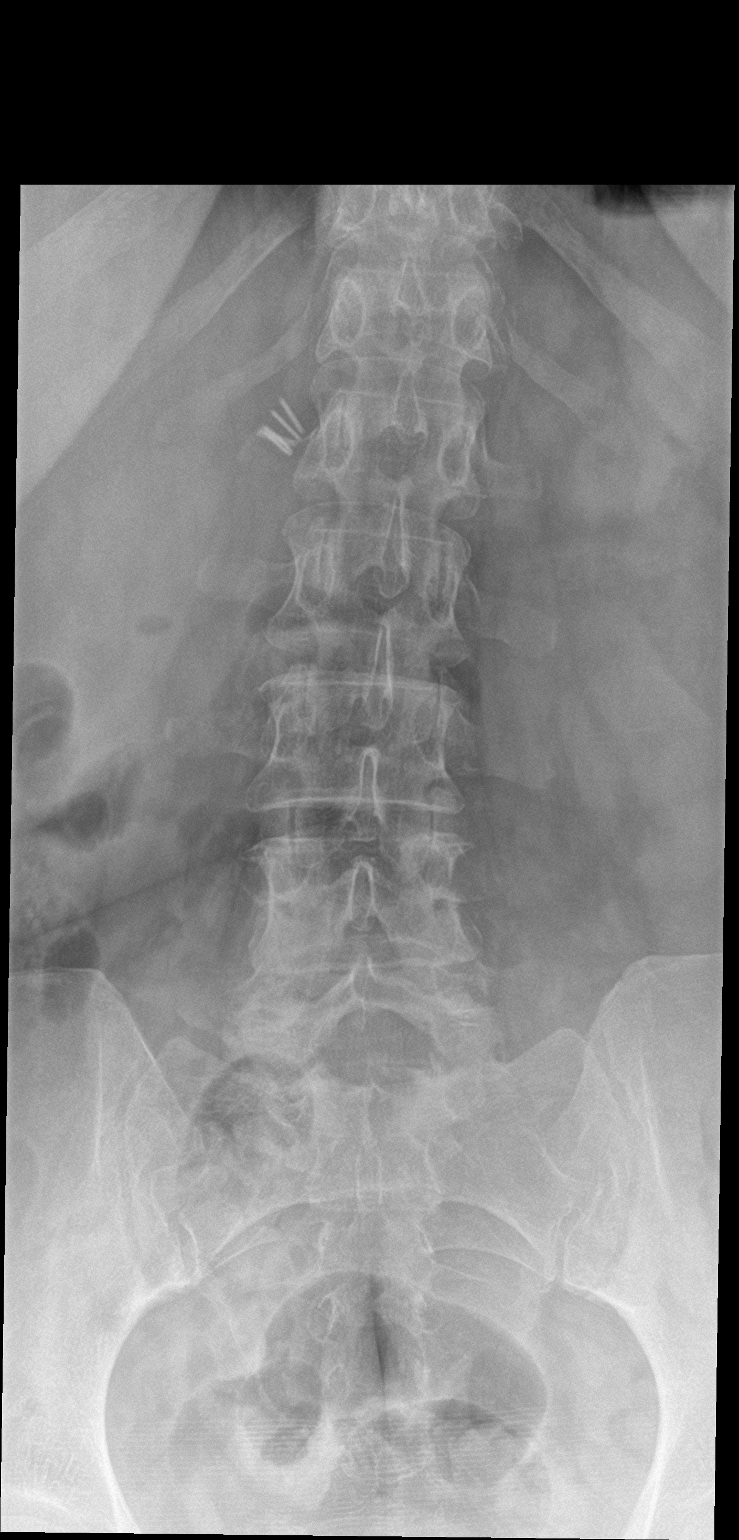

[l-spine spot]
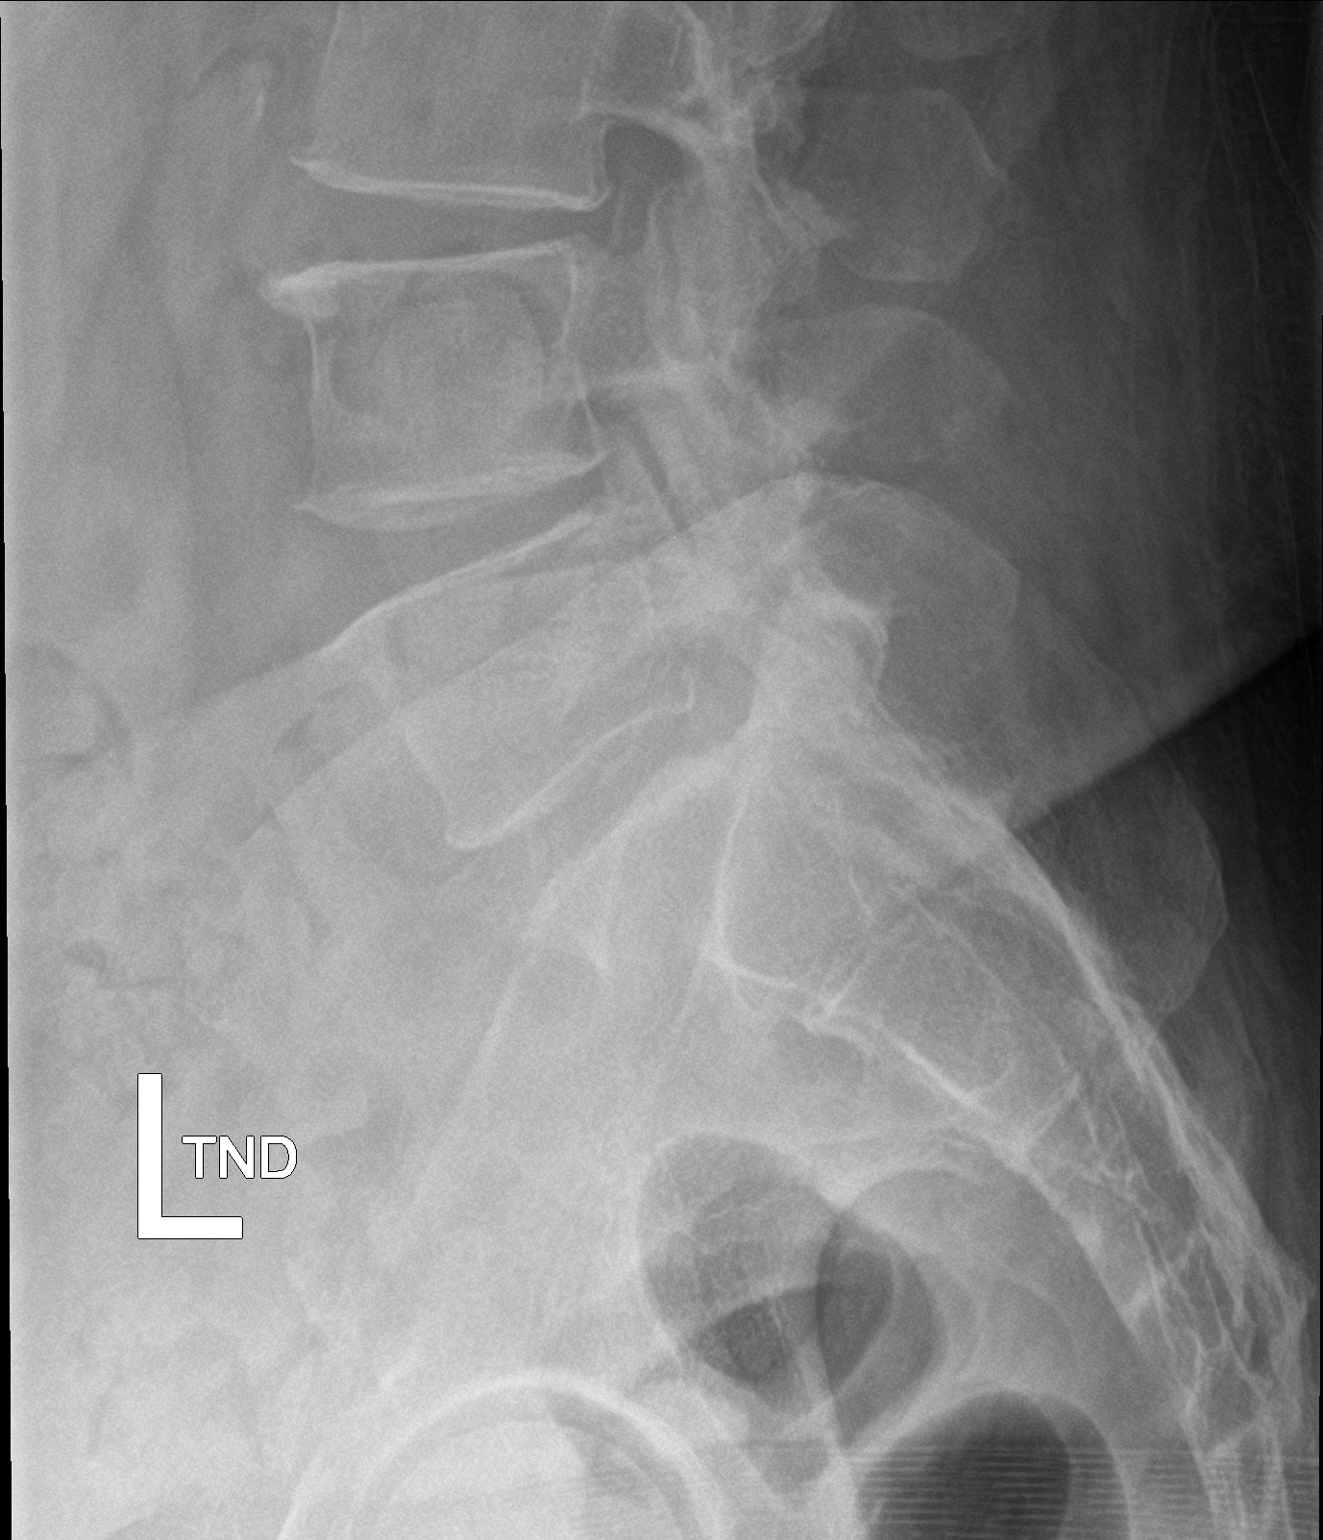

[l-spine lat]
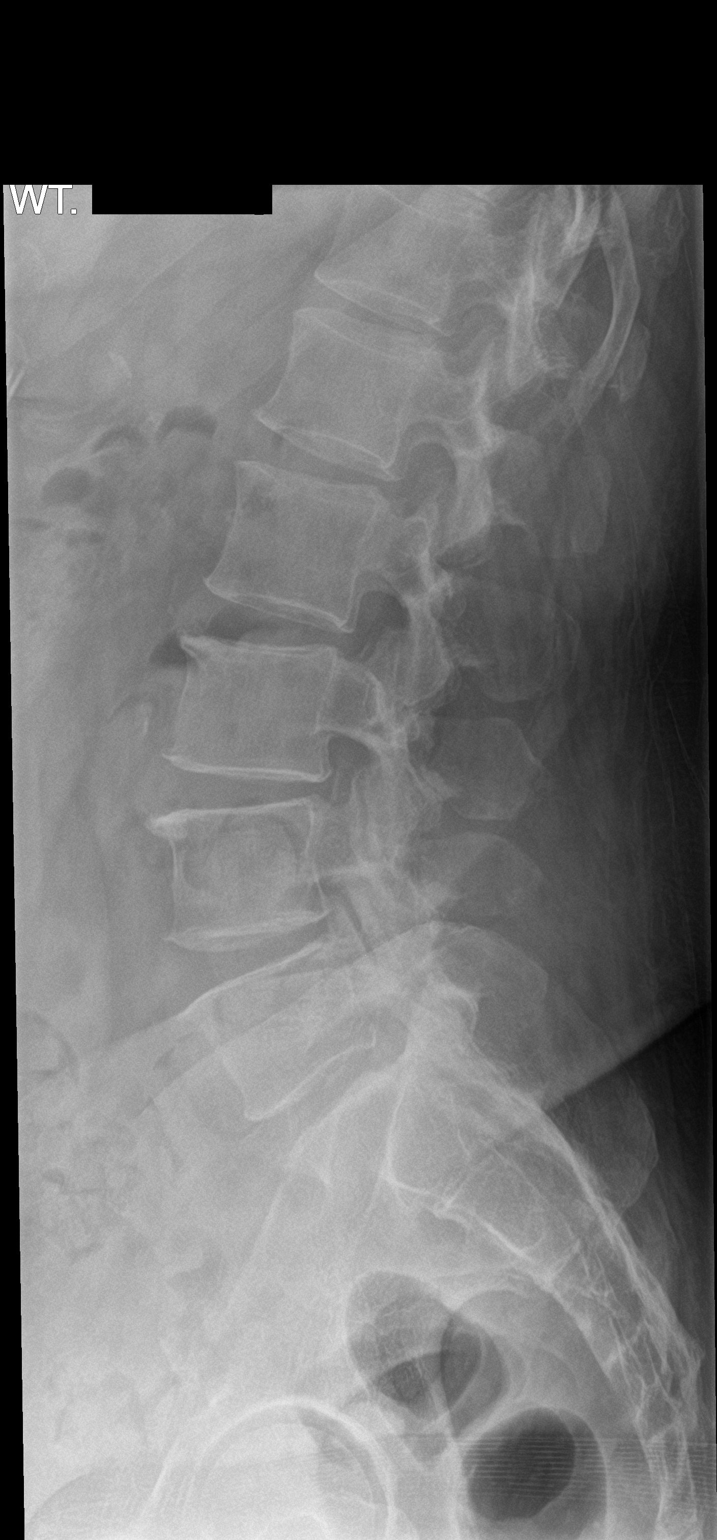

[3 of 3 positions shown; findings below may reference images not displayed]

FINDINGS: Five lumbar type vertebral bodies are well visualized. Vertebral
body height is well maintained. Osteophytic changes are seen. No
soft tissue abnormality is noted. Changes of prior cholecystectomy
are seen.
IMPRESSION: No acute abnormality noted.  Mild degenerative changes seen.

## 2022-04-29 ENCOUNTER — Encounter: Payer: Self-pay | Admitting: Internal Medicine

## 2022-04-29 ENCOUNTER — Ambulatory Visit
Admission: RE | Admit: 2022-04-29 | Discharge: 2022-04-29 | Disposition: A | Discharge status: Home / Self Care | Payer: Medicaid Other | Attending: Internal Medicine | Admitting: Internal Medicine

## 2022-04-29 ENCOUNTER — Inpatient Hospital Stay: Payer: Medicaid Other

## 2022-04-29 ENCOUNTER — Ambulatory Visit
Admission: RE | Admit: 2022-04-29 | Discharge: 2022-04-29 | Disposition: A | Discharge status: Home / Self Care | Payer: Medicaid Other | Source: Ambulatory Visit | Attending: Internal Medicine | Admitting: Internal Medicine

## 2022-04-29 ENCOUNTER — Inpatient Hospital Stay: Payer: Medicaid Other | Attending: Internal Medicine | Admitting: Internal Medicine

## 2022-04-29 ENCOUNTER — Other Ambulatory Visit: Payer: Self-pay | Admitting: Family Medicine

## 2022-04-29 VITALS — BP 195/95 | HR 112 | Temp 97.9°F | Resp 18 | Ht 67.0 in | Wt 247.6 lb

## 2022-04-29 DIAGNOSIS — D472 Monoclonal gammopathy: Secondary | ICD-10-CM | POA: Insufficient documentation

## 2022-04-29 DIAGNOSIS — N61 Mastitis without abscess: Secondary | ICD-10-CM

## 2022-04-29 DIAGNOSIS — Z79899 Other long term (current) drug therapy: Secondary | ICD-10-CM

## 2022-04-29 DIAGNOSIS — N63 Unspecified lump in unspecified breast: Secondary | ICD-10-CM

## 2022-04-29 DIAGNOSIS — F17210 Nicotine dependence, cigarettes, uncomplicated: Secondary | ICD-10-CM | POA: Diagnosis not present

## 2022-04-29 LAB — CBC WITH DIFFERENTIAL/PLATELET
Abs Immature Granulocytes: 0.02 10*3/uL (ref 0.00–0.07)
Basophils Absolute: 0.1 10*3/uL (ref 0.0–0.1)
Basophils Relative: 1 %
Eosinophils Absolute: 0.1 10*3/uL (ref 0.0–0.5)
Eosinophils Relative: 1 %
HCT: 44.2 % (ref 36.0–46.0)
Hemoglobin: 15.6 g/dL — ABNORMAL HIGH (ref 12.0–15.0)
Immature Granulocytes: 0 %
Lymphocytes Relative: 52 %
Lymphs Abs: 4.5 10*3/uL — ABNORMAL HIGH (ref 0.7–4.0)
MCH: 33.3 pg (ref 26.0–34.0)
MCHC: 35.3 g/dL (ref 30.0–36.0)
MCV: 94.2 fL (ref 80.0–100.0)
Monocytes Absolute: 0.5 10*3/uL (ref 0.1–1.0)
Monocytes Relative: 6 %
Neutro Abs: 3.4 10*3/uL (ref 1.7–7.7)
Neutrophils Relative %: 40 %
Platelets: 225 10*3/uL (ref 150–400)
RBC: 4.69 MIL/uL (ref 3.87–5.11)
RDW: 12.4 % (ref 11.5–15.5)
WBC: 8.7 10*3/uL (ref 4.0–10.5)
nRBC: 0 % (ref 0.0–0.2)

## 2022-04-29 LAB — LACTATE DEHYDROGENASE: LDH: 120 U/L (ref 98–192)

## 2022-04-29 LAB — COMPREHENSIVE METABOLIC PANEL
ALT: 27 U/L (ref 0–44)
AST: 30 U/L (ref 15–41)
Albumin: 3.9 g/dL (ref 3.5–5.0)
Alkaline Phosphatase: 96 U/L (ref 38–126)
Anion gap: 10 (ref 5–15)
BUN: 10 mg/dL (ref 6–20)
CO2: 23 mmol/L (ref 22–32)
Calcium: 9.1 mg/dL (ref 8.9–10.3)
Chloride: 103 mmol/L (ref 98–111)
Creatinine, Ser: 0.82 mg/dL (ref 0.44–1.00)
GFR, Estimated: >60 mL/min (ref >60)
Glucose, Bld: 207 mg/dL — ABNORMAL HIGH (ref 70–99)
Potassium: 3.5 mmol/L (ref 3.5–5.1)
Sodium: 136 mmol/L (ref 135–145)
Total Bilirubin: 0.5 mg/dL (ref 0.3–1.2)
Total Protein: 8.3 g/dL — ABNORMAL HIGH (ref 6.5–8.1)

## 2022-04-29 NOTE — Progress Notes (Signed)
New patient evaluation with no acute concerns/problems today.  BP 190/95, HR 112-takes bp med in evening and is nervous.

## 2022-04-29 NOTE — Assessment & Plan Note (Addendum)
#   FEB 2024-incidental PCP]-M protein-1.7 g/dL IgM kappa.  Renal function calcium normal.  No recent hemoglobin data.  # Given the absence of significant abnormality of renal parameter I doubt if patient has any active multiple myeloma.  Clinically suspicious for MGUS.   # Recommend CBC CMP myeloma panel kappa lambda light chain ratio LDH for further evaluation.  # Active smoker-patient is motivated to quit smoking.  Starting Chantix.  Thank you Dr.George for allowing me to participate in the care of your pleasant patient. Please do not hesitate to contact me with questions or concerns in the interim.   # DISPOSITION: # labs ordered today;  # follow up in 2-3 weeks- MD: no labs- Dr.B

## 2022-04-29 NOTE — Progress Notes (Signed)
[facility] CONSULT NOTE  Patient Care Team: Sharyne Peach, MD as PCP - General (Family Medicine)  CHIEF COMPLAINTS/PURPOSE OF CONSULTATION: Monoclonal gammopathy  # FEB 2024-renal function normal calcium normal CBC normal.  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   Oncology History   No history exists.    HISTORY OF PRESENTING ILLNESS: Patient ambulating-independently.  Alone.  Amanda Fitzgerald 62 y.o.  female with longstanding history of smoking is currently referred to [place] for further evaluation recommendations for elevated M protein.  Patient has chronic mild cough.  Denies any worsening shortness of breath.  Denies any hemoptysis.  Denies any tingling or numbness.  Denies any nausea vomiting abdominal pain.   Review of Systems  Constitutional:  Negative for chills, diaphoresis, fever, malaise/fatigue and weight loss.  HENT:  Negative for nosebleeds and sore throat.   Eyes:  Negative for double vision.  Respiratory:  Negative for cough, hemoptysis, sputum production, shortness of breath and wheezing.   Cardiovascular:  Negative for chest pain, palpitations, orthopnea and leg swelling.  Gastrointestinal:  Negative for abdominal pain, blood in stool, constipation, diarrhea, heartburn, melena, nausea and vomiting.  Genitourinary:  Negative for dysuria, frequency and urgency.  Musculoskeletal:  Negative for back pain and joint pain.  Skin: Negative.  Negative for itching and rash.  Neurological:  Negative for dizziness, tingling, focal weakness, weakness and  headaches.  Endo/Heme/Allergies:  Does not bruise/bleed easily.  Psychiatric/Behavioral:  Negative for depression. The patient is not nervous/anxious and does not have insomnia.     MEDICAL HISTORY:  Past Medical History:  Diagnosis Date   BCC (basal cell carcinoma of skin)    Hyperlipidemia    Hypertension    Hypokalemia    Ichthyosis    Insomnia    Lumbar degenerative disc disease    Prediabetes     SURGICAL HISTORY: Past Surgical History:  Procedure Laterality Date   ABDOMINAL HYSTERECTOMY     BREAST EXCISIONAL BIOPSY Bilateral 2007   BREAST SURGERY     CESAREAN SECTION  11/2021   CHOLECYSTECTOMY      SOCIAL HISTORY: Social History   Socioeconomic History   Marital status: Married    Spouse name: Not on file   Number of children: Not on file   Years of education: Not on file   Highest education level: Not on file  Occupational History   Not on file  Tobacco Use   Smoking status: Every Day    Packs/day: 0.25    Years: 45.00    Total pack years: 11.25    Types: Cigarettes   Smokeless tobacco: Never  Substance and Sexual Activity   Alcohol use: No   Drug use: No   Sexual activity: Not on file  Other Topics Concern   Not on file  Social History Narrative   Not on file   Social Determinants of Health   Financial Resource Strain: Not on file  Food Insecurity:  No Food Insecurity (04/29/2022)   Hunger Vital Sign    Worried About Running Out of Food in the Last Year: Never true    Ran Out of Food in the Last Year: Never true  Transportation Needs: No Transportation Needs (04/29/2022)   PRAPARE - Hydrologist (Medical): No    Lack of Transportation (Non-Medical): No  Physical Activity: Not on file  Stress: Not on file  Social Connections: Not on file  Intimate Partner Violence: Not At Risk (04/29/2022)   Humiliation, Afraid, Rape, and Kick questionnaire    Fear of Current or Ex-Partner: No    Emotionally Abused: No     Physically Abused: No    Sexually Abused: No    FAMILY HISTORY: Family History  Problem Relation Age of Onset   Melanoma Mother    Lung cancer Mother    Leukemia Brother    Breast cancer Maternal Aunt 63    ALLERGIES:  is allergic to dilaudid [hydromorphone hcl], hydromorphone, and lisinopril.  MEDICATIONS:  Current Outpatient Medications  Medication Sig Dispense Refill   albuterol (VENTOLIN HFA) 108 (90 Base) MCG/ACT inhaler Inhale 2 puffs into the lungs every 4 (four) hours as needed.     amLODipine (NORVASC) 10 MG tablet Take 1 tablet by mouth daily.     cyclobenzaprine (FLEXERIL) 10 MG tablet Take 1 tablet by mouth 3 (three) times daily as needed.     meloxicam (MOBIC) 15 MG tablet Take 15 mg by mouth daily as needed.     metoprolol tartrate (LOPRESSOR) 25 MG tablet Take 1 tablet by mouth 2 (two) times daily.     Varenicline Tartrate, Starter, 0.5 MG X 11 & 1 MG X 42 TBPK See admin instructions.     ketorolac (TORADOL) 10 MG tablet Take 1 tablet (10 mg total) by mouth every 6 (six) hours as needed. (Patient not taking: Reported on 04/29/2022) 20 tablet 0   methocarbamol (ROBAXIN-750) 750 MG tablet Take 1 tablet (750 mg total) by mouth 4 (four) times daily. (Patient not taking: Reported on 04/29/2022) 20 tablet 0   traMADol (ULTRAM) 50 MG tablet Take 1 tablet (50 mg total) by mouth every 6 (six) hours as needed for moderate pain. (Patient not taking: Reported on 04/29/2022) 12 tablet 0   No current facility-administered medications for this visit.    PHYSICAL EXAMINATION:   Vitals:   04/29/22 1400  BP: (!) 195/95  Pulse: (!) 112  Resp: 18  Temp: 97.9 F (36.6 C)   Filed Weights   04/29/22 1400  Weight: 247 lb 9.6 oz (112.3 kg)    Physical Exam Vitals and nursing note reviewed.  HENT:     Head: Normocephalic and atraumatic.     Mouth/Throat:     Pharynx: Oropharynx is clear.  Eyes:     Extraocular Movements: Extraocular movements intact.     Pupils: Pupils are  equal, round, and reactive to light.  Cardiovascular:     Rate and Rhythm: Normal rate and regular rhythm.  Pulmonary:     Comments: Decreased breath sounds bilaterally.  Abdominal:     Palpations: Abdomen is soft.  Musculoskeletal:        General: Normal range of motion.     Cervical back: Normal range of motion.  Skin:    General: Skin is warm.  Neurological:     General: No focal deficit present.     Mental Status: She is alert and oriented to person, place, and time.  Psychiatric:        Behavior: Behavior normal.        Judgment: Judgment normal.     LABORATORY DATA:  I have reviewed the data as listed Lab Results  Component Value Date   WBC 8.7 04/29/2022   HGB 15.6 (H) 04/29/2022   HCT 44.2 04/29/2022   MCV 94.2 04/29/2022   PLT 225 04/29/2022   Recent Labs    04/29/22 1446  NA 136  K 3.5  CL 103  CO2 23  GLUCOSE 207*  BUN 10  CREATININE 0.82  CALCIUM 9.1  GFRNONAA >60  PROT 8.3*  ALBUMIN 3.9  AST 30  ALT 27  ALKPHOS 96  BILITOT 0.5    RADIOGRAPHIC STUDIES: I have personally reviewed the radiological images as listed and agreed with the findings in the report. No results found.   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  Renal function calcium normal.  No recent hemoglobin data.  # Given the absence of significant abnormality of renal parameter I doubt if patient has any active multiple myeloma.  Clinically suspicious for MGUS.   # Recommend CBC CMP myeloma panel kappa lambda light chain ratio LDH for further evaluation.  # Active smoker-patient is motivated to quit smoking.  Starting Chantix.  Thank you Dr.George for allowing me to participate in the care of your pleasant patient. Please do not hesitate to contact me with questions or concerns in the interim.   # DISPOSITION: # labs ordered today;  # follow up in 2-3 weeks- MD: no labs- Dr.B Above plan of care was discussed with patient/family in  detail.  My contact information was given to the patient/family.     Cammie Sickle, MD 04/29/2022 3:52 PM

## 2022-04-30 ENCOUNTER — Inpatient Hospital Stay
Admission: RE | Admit: 2022-04-30 | Discharge: 2022-04-30 | Disposition: A | Discharge status: Home / Self Care | Payer: Self-pay | Source: Ambulatory Visit | Attending: *Deleted | Admitting: *Deleted

## 2022-04-30 ENCOUNTER — Other Ambulatory Visit: Payer: Self-pay | Admitting: *Deleted

## 2022-04-30 DIAGNOSIS — Z1231 Encounter for screening mammogram for malignant neoplasm of breast: Secondary | ICD-10-CM

## 2022-04-30 LAB — KAPPA/LAMBDA LIGHT CHAINS
Kappa free light chain: 83.7 mg/L — ABNORMAL HIGH (ref 3.3–19.4)
Kappa, lambda light chain ratio: 4.68 — ABNORMAL HIGH (ref 0.26–1.65)
Lambda free light chains: 17.9 mg/L (ref 5.7–26.3)

## 2022-05-05 LAB — MULTIPLE MYELOMA PANEL, SERUM
Albumin SerPl Elph-Mcnc: 3.9 g/dL (ref 2.9–4.4)
Albumin/Glob SerPl: 1 (ref 0.7–1.7)
Alpha 1: 0.2 g/dL (ref 0.0–0.4)
Alpha2 Glob SerPl Elph-Mcnc: 0.8 g/dL (ref 0.4–1.0)
B-Globulin SerPl Elph-Mcnc: 1.2 g/dL (ref 0.7–1.3)
Gamma Glob SerPl Elph-Mcnc: 1.7 g/dL (ref 0.4–1.8)
Globulin, Total: 4 g/dL — ABNORMAL HIGH (ref 2.2–3.9)
IgA: 1488 mg/dL — ABNORMAL HIGH (ref 87–352)
IgG (Immunoglobin G), Serum: 1093 mg/dL (ref 586–1602)
IgM (Immunoglobulin M), Srm: 50 mg/dL (ref 26–217)
M Protein SerPl Elph-Mcnc: 0.7 g/dL — ABNORMAL HIGH
Total Protein ELP: 7.9 g/dL (ref 6.0–8.5)

## 2022-05-08 ENCOUNTER — Ambulatory Visit
Admission: RE | Admit: 2022-05-08 | Discharge: 2022-05-08 | Disposition: A | Discharge status: Home / Self Care | Payer: Medicaid Other | Source: Ambulatory Visit | Attending: Family Medicine | Admitting: Family Medicine

## 2022-05-08 DIAGNOSIS — N61 Mastitis without abscess: Secondary | ICD-10-CM | POA: Diagnosis present

## 2022-05-08 DIAGNOSIS — N63 Unspecified lump in unspecified breast: Secondary | ICD-10-CM

## 2022-05-15 ENCOUNTER — Encounter: Payer: Self-pay | Admitting: Internal Medicine

## 2022-05-15 ENCOUNTER — Inpatient Hospital Stay: Payer: Medicaid Other | Attending: Internal Medicine | Admitting: Internal Medicine

## 2022-05-15 VITALS — BP 158/85 | HR 87 | Temp 96.7°F | Resp 18 | Wt 251.1 lb

## 2022-05-15 DIAGNOSIS — D472 Monoclonal gammopathy: Secondary | ICD-10-CM | POA: Diagnosis present

## 2022-05-15 DIAGNOSIS — Z79899 Other long term (current) drug therapy: Secondary | ICD-10-CM | POA: Diagnosis not present

## 2022-05-15 DIAGNOSIS — F17210 Nicotine dependence, cigarettes, uncomplicated: Secondary | ICD-10-CM | POA: Insufficient documentation

## 2022-05-15 NOTE — Progress Notes (Signed)
[facility] CONSULT NOTE  Patient Care Team: Sharyne Peach, MD as PCP - General (Family Medicine)  CHIEF COMPLAINTS/PURPOSE OF CONSULTATION: Monoclonal gammopathy  # FEB 2024-renal function normal calcium normal CBC normal.  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   Oncology History   No history exists.    HISTORY OF PRESENTING ILLNESS: Patient ambulating-independently.  With family.  Amanda Fitzgerald 62 y.o.  female with longstanding history of smoking i is here to follow-up review results of blood work/x-rays ordered for elevated M protein.  Patient in the process of quitting smoking.  She is on Chantix.   Patient has chronic mild cough.  Denies any worsening shortness of breath. .  Denies any tingling or numbness.    Review of Systems  Constitutional:  Negative for chills, diaphoresis, fever, malaise/fatigue and weight loss.  HENT:  Negative for nosebleeds and sore throat.   Eyes:  Negative for double vision.  Respiratory:  Negative for cough, hemoptysis, sputum production, shortness of breath and wheezing.   Cardiovascular:  Negative for chest pain, palpitations, orthopnea and leg swelling.  Gastrointestinal:  Negative for abdominal pain, blood in stool, constipation, diarrhea, heartburn, melena, nausea and vomiting.  Genitourinary:  Negative for dysuria, frequency and urgency.  Musculoskeletal:  Negative for back pain and joint pain.  Skin: Negative.  Negative for itching and rash.  Neurological:  Negative for dizziness, tingling, focal weakness,  weakness and headaches.  Endo/Heme/Allergies:  Does not bruise/bleed easily.  Psychiatric/Behavioral:  Negative for depression. The patient is not nervous/anxious and does not have insomnia.     MEDICAL HISTORY:  Past Medical History:  Diagnosis Date   BCC (basal cell carcinoma of skin)    Hyperlipidemia    Hypertension    Hypokalemia    Ichthyosis    Insomnia    Lumbar degenerative disc disease    Prediabetes     SURGICAL HISTORY: Past Surgical History:  Procedure Laterality Date   ABDOMINAL HYSTERECTOMY     BREAST EXCISIONAL BIOPSY Right 2007   BREAST SURGERY     CESAREAN SECTION  11/2021   CHOLECYSTECTOMY      SOCIAL HISTORY: Social History   Socioeconomic History   Marital status: Married    Spouse name: Not on file   Number of children: Not on file   Years of education: Not on file   Highest education level: Not on file  Occupational History   Not on file  Tobacco Use   Smoking status: Every Day    Packs/day: 0.25    Years: 45.00    Total pack years: 11.25    Types: Cigarettes   Smokeless tobacco: Never  Substance and Sexual Activity   Alcohol use: No   Drug use: No   Sexual activity: Not on file  Other Topics Concern   Not on file  Social History Narrative   Not on file   Social Determinants of Health   Financial  Resource Strain: Not on file  Food Insecurity: No Food Insecurity (04/29/2022)   Hunger Vital Sign    Worried About Running Out of Food in the Last Year: Never true    Ran Out of Food in the Last Year: Never true  Transportation Needs: No Transportation Needs (04/29/2022)   PRAPARE - Hydrologist (Medical): No    Lack of Transportation (Non-Medical): No  Physical Activity: Not on file  Stress: Not on file  Social Connections: Not on file  Intimate Partner Violence: Not At Risk (04/29/2022)   Humiliation, Afraid, Rape, and Kick questionnaire    Fear of Current or Ex-Partner: No    Emotionally Abused: No     Physically Abused: No    Sexually Abused: No    FAMILY HISTORY: Family History  Problem Relation Age of Onset   Melanoma Mother    Lung cancer Mother    Leukemia Brother    Breast cancer Maternal Aunt 63    ALLERGIES:  is allergic to dilaudid [hydromorphone hcl], hydromorphone, and lisinopril.  MEDICATIONS:  Current Outpatient Medications  Medication Sig Dispense Refill   albuterol (VENTOLIN HFA) 108 (90 Base) MCG/ACT inhaler Inhale 2 puffs into the lungs every 4 (four) hours as needed.     amLODipine (NORVASC) 10 MG tablet Take 1 tablet by mouth daily.     cyclobenzaprine (FLEXERIL) 10 MG tablet Take 1 tablet by mouth 3 (three) times daily as needed.     meloxicam (MOBIC) 15 MG tablet Take 15 mg by mouth daily as needed.     metoprolol tartrate (LOPRESSOR) 25 MG tablet Take 1 tablet by mouth 2 (two) times daily.     Varenicline Tartrate, Starter, 0.5 MG X 11 & 1 MG X 42 TBPK See admin instructions.     ketorolac (TORADOL) 10 MG tablet Take 1 tablet (10 mg total) by mouth every 6 (six) hours as needed. (Patient not taking: Reported on 04/29/2022) 20 tablet 0   methocarbamol (ROBAXIN-750) 750 MG tablet Take 1 tablet (750 mg total) by mouth 4 (four) times daily. (Patient not taking: Reported on 04/29/2022) 20 tablet 0   traMADol (ULTRAM) 50 MG tablet Take 1 tablet (50 mg total) by mouth every 6 (six) hours as needed for moderate pain. (Patient not taking: Reported on 04/29/2022) 12 tablet 0   No current facility-administered medications for this visit.    PHYSICAL EXAMINATION:   Vitals:   05/15/22 0900  BP: (!) 158/85  Pulse: 87  Resp: 18  Temp: (!) 96.7 F (35.9 C)   Filed Weights   05/15/22 0900  Weight: 251 lb 1.6 oz (113.9 kg)    Physical Exam Vitals and nursing note reviewed.  HENT:     Head: Normocephalic and atraumatic.     Mouth/Throat:     Pharynx: Oropharynx is clear.  Eyes:     Extraocular Movements: Extraocular movements intact.     Pupils: Pupils are  equal, round, and reactive to light.  Cardiovascular:     Rate and Rhythm: Normal rate and regular rhythm.  Pulmonary:     Comments: Decreased breath sounds bilaterally.  Abdominal:     Palpations: Abdomen is soft.  Musculoskeletal:        General: Normal range of motion.     Cervical back: Normal range of motion.  Skin:    General: Skin is warm.  Neurological:     General: No focal deficit present.     Mental Status: She is  alert and oriented to person, place, and time.  Psychiatric:        Behavior: Behavior normal.        Judgment: Judgment normal.     LABORATORY DATA:  I have reviewed the data as listed Lab Results  Component Value Date   WBC 8.7 04/29/2022   HGB 15.6 (H) 04/29/2022   HCT 44.2 04/29/2022   MCV 94.2 04/29/2022   PLT 225 04/29/2022   Recent Labs    04/29/22 1446  NA 136  K 3.5  CL 103  CO2 23  GLUCOSE 207*  BUN 10  CREATININE 0.82  CALCIUM 9.1  GFRNONAA >60  PROT 8.3*  ALBUMIN 3.9  AST 30  ALT 27  ALKPHOS 96  BILITOT 0.5    RADIOGRAPHIC STUDIES: I have personally reviewed the radiological images as listed and agreed with the findings in the report. MM DIAG BREAST TOMO BILATERAL  Result Date: 05/08/2022 CLINICAL DATA:  Cellulitis in the LEFT breast. Patient has 2 areas of concern, where she palpated a mass associated with redness and heat. Patient has been on doxycycline, and has noted significant improvement in the redness and heat. She still feels 2 small lumps. EXAM: DIGITAL DIAGNOSTIC BILATERAL MAMMOGRAM WITH TOMOSYNTHESIS; ULTRASOUND LEFT BREAST LIMITED TECHNIQUE: Bilateral digital diagnostic mammography and breast tomosynthesis was performed.; Targeted ultrasound examination of the left breast was performed. COMPARISON:  Previous exam(s). ACR Breast Density Category a: The breasts are almost entirely fatty. FINDINGS: RIGHT breast is negative. LEFT breast: Spot tangential views are performed in the areas of concern, showing no suspicious  abnormality. No suspicious mass, distortion, or microcalcifications are identified to suggest presence of malignancy. On physical exam, there is a small papule in the 8 o'clock location of the LEFT breast, associated with small punctum. A similar mass is identified in the 3 o'clock location 4 centimeters from the nipple. In addition, patient has numerous similar papules across the LOWER quadrants of the breast. Patient has minimal erythema along the inframammary fold. Targeted ultrasound is performed, showing an intradermal collection in the 3 o'clock location of the LEFT breast 4 centimeters from the nipple measuring 1.0 x 0.8 x 0.2 centimeters. In the 8 o'clock location 7 centimeters from the nipple, the second mass demonstrates a similar intradermal collection measuring 0.5 x 0.8 x 0 1 centimeter. No suspicious underlying parenchymal mass identified at either site. IMPRESSION: Findings are consistent with intradermal inclusion cyst, possibly the source of recent cellulitis. There is no active cellulitis at this time. Patient has minimal erythema along the inframammary fold, likely chronic. RECOMMENDATION: Recommend screening mammogram in 1 year. I have discussed the findings and recommendations with the patient. If applicable, a reminder letter will be sent to the patient regarding the next appointment. BI-RADS CATEGORY  2: Benign. Electronically Signed   By: Nolon Nations M.D.   On: 05/08/2022 15:08  US BREAST LTD UNI LEFT INC AXILLA  Result Date: 05/08/2022 CLINICAL DATA:  Cellulitis in the LEFT breast. Patient has 2 areas of concern, where she palpated a mass associated with redness and heat. Patient has been on doxycycline, and has noted significant improvement in the redness and heat. She still feels 2 small lumps. EXAM: DIGITAL DIAGNOSTIC BILATERAL MAMMOGRAM WITH TOMOSYNTHESIS; ULTRASOUND LEFT BREAST LIMITED TECHNIQUE: Bilateral digital diagnostic mammography and breast tomosynthesis was performed.;  Targeted ultrasound examination of the left breast was performed. COMPARISON:  Previous exam(s). ACR Breast Density Category a: The breasts are almost entirely fatty. FINDINGS: RIGHT breast is negative. LEFT  breast: Spot tangential views are performed in the areas of concern, showing no suspicious abnormality. No suspicious mass, distortion, or microcalcifications are identified to suggest presence of malignancy. On physical exam, there is a small papule in the 8 o'clock location of the LEFT breast, associated with small punctum. A similar mass is identified in the 3 o'clock location 4 centimeters from the nipple. In addition, patient has numerous similar papules across the LOWER quadrants of the breast. Patient has minimal erythema along the inframammary fold. Targeted ultrasound is performed, showing an intradermal collection in the 3 o'clock location of the LEFT breast 4 centimeters from the nipple measuring 1.0 x 0.8 x 0.2 centimeters. In the 8 o'clock location 7 centimeters from the nipple, the second mass demonstrates a similar intradermal collection measuring 0.5 x 0.8 x 0 1 centimeter. No suspicious underlying parenchymal mass identified at either site. IMPRESSION: Findings are consistent with intradermal inclusion cyst, possibly the source of recent cellulitis. There is no active cellulitis at this time. Patient has minimal erythema along the inframammary fold, likely chronic. RECOMMENDATION: Recommend screening mammogram in 1 year. I have discussed the findings and recommendations with the patient. If applicable, a reminder letter will be sent to the patient regarding the next appointment. BI-RADS CATEGORY  2: Benign. Electronically Signed   By: Nolon Nations M.D.   On: 05/08/2022 15:08  DG Bone Survey Met  Result Date: 05/02/2022 CLINICAL DATA:  Multiple myeloma EXAM: METASTATIC BONE SURVEY COMPARISON:  None Available. FINDINGS: Chest x-ray: No consolidation, pneumothorax or effusion. Normal  cardiopericardial silhouette without edema. Diffuse interstitial changes are seen. This could be chronic. Please correlate with any prior or follow-up. No definite lytic or sclerotic bone lesion. Skull x-ray: Lateral view of the skull is without lytic or sclerotic bone lesion. Preserved bone mineralization. Humerus: Single view of each humerus is without fracture or dislocation. Preserved bone mineralization. Degenerative changes seen in the shoulder. No lytic or sclerotic bone lesion. Cervical spine: Preserved vertebral body height, alignment and prevertebral soft tissues. There is some disc height loss seen with some osteophytes C5-6 and C6-7. Mild facet degenerative changes. No lytic or sclerotic bone lesion. Thoracic spine: Slight levoconvex curvature of the lower thoracic spine. Preserved vertebral body height. Multilevel disc height loss seen with endplate osteophytes along the mid to lower thoracic spine. No lytic or sclerotic bone lesion. Lumbar spine: Surgical clips in the right upper quadrant of the abdomen. Five lumbar-type vertebral bodies. Preserved vertebral body height. Mild disc height loss at L4-5 and L5-S1. Mild endplate osteophytes. Prominent lower lumbar facet degenerative changes. No lytic or sclerotic bone lesion. Forearms: Single view of each forearm is without fracture or dislocation. Preserved joint spaces and bone mineralization. No lytic or sclerotic bone lesion. Pelvis: No fracture or dislocation. Preserved bone mineralization and joint spaces. No lytic or sclerotic bone lesion. Femur: No fracture or dislocation. Preserved joint spaces. No lytic or sclerotic bone lesion. Tibia and fibula: No fracture or dislocation. Preserved joint spaces and bone mineralization. No lytic or sclerotic bone lesion IMPRESSION: No lytic or sclerotic bone lesions identified. Multifocal degenerative changes seen particularly along the spine. Mild interstitial changes the lungs. This could be chronic. Please  correlate with any prior or follow-up x-ray Electronically Signed   By: Jill Side M.D.   On: 05/02/2022 10:46   MM Outside Films Mammo  Result Date: 04/30/2022 This examination belongs to an outside facility and is stored here for comparison purposes only.  Contact the originating outside institution for  any associated report or interpretation.  MM Outside Films Mammo  Result Date: 04/30/2022 This examination belongs to an outside facility and is stored here for comparison purposes only.  Contact the originating outside institution for any associated report or interpretation.    Monoclonal gammopathy # FEB 2024-incidental PCP]-M protein-1.7 g/dL IgA kappa; kappa lambda light chain ratio 4.5. CBC-normal hemoglobin 15; absolute lymphocyte count 4.5-slightly elevated.  Renal function calcium normal.  FEB 2024-skeletal survey showed degenerative arthritis evidence of any lytic lesions.  Suggestive of MGUS.  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  Clinically, patient does not have active myeloma at this time.  Also discussed regarding bone marrow biopsy for further evaluation.  However, as this is likely not going to change her management I think is reasonable to hold off bone marrow biopsy at this time.  Discussed that bone marrow biopsy will be recommended if significant elevation of myeloma protein/worsening anemia/renal function.  However, recommend checking myeloma panel; kappa lambda light chain ratio CBC CMP; every 6 months.   # Mild lymphocytosis 4.5-likely reactive monitor for now-low clinical suspicion for any lymphoproliferative process.  If consistently elevated we will plan flow cytometry.  # Active smoker-patient is motivated to quit smoking.  Starting Chantix.  Again counseled importance of quitting smoking.   # Obesity:  Discussed importance of healthy weight/and weight loss.  Strongly recommend  eating more green leafy vegetables and cutting down processed food/ carbohydrates.  Instead increasing whole grains / protein in the diet.  Multiple studies have shown that optimal weight would help improve cardiovascular risk; also shown to cut on the risk of malignancies-colon cancer, breast cancer ovarian/uterine cancer in women. Screening: March 2024- mammo- wnl; colo- prior.   #Incidental findings on Imaging  X-rays -2024: Arthritic changes; also interstitial lung changes-related to smoking.  Recommend follow-up with PCP.  I reviewed/discussed/counseled the patient.   # DISPOSITION: # follow up in 6 months. MD: 2 weeks prior-Labs CBC CMP myeloma panel kappa lambda light chain ratio -.Dr.B Above plan of care was discussed with patient/family in detail.  My contact information was given to the patient/family.     Cammie Sickle, MD 05/15/2022 1:10 PM

## 2022-05-15 NOTE — Progress Notes (Signed)
Patient denies new problems/concerns today.   °

## 2022-05-15 NOTE — Assessment & Plan Note (Addendum)
#   FEB 2024-incidental PCP]-M protein-1.7 g/dL IgA kappa; kappa lambda light chain ratio 4.5. CBC-normal hemoglobin 15; absolute lymphocyte count 4.5-slightly elevated.  Renal function calcium normal.  FEB 2024-skeletal survey showed degenerative arthritis evidence of any lytic lesions.  Suggestive of MGUS.  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  Clinically, patient does not have active myeloma at this time.  Also discussed regarding bone marrow biopsy for further evaluation.  However, as this is likely not going to change her management I think is reasonable to hold off bone marrow biopsy at this time.  Discussed that bone marrow biopsy will be recommended if significant elevation of myeloma protein/worsening anemia/renal function.  However, recommend checking myeloma panel; kappa lambda light chain ratio CBC CMP; every 6 months.   # Mild lymphocytosis 4.5-likely reactive monitor for now-low clinical suspicion for any lymphoproliferative process.  If consistently elevated we will plan flow cytometry.  # Active smoker-patient is motivated to quit smoking.  Starting Chantix.  Again counseled importance of quitting smoking.   # Obesity:  Discussed importance of healthy weight/and weight loss.  Strongly recommend eating more green leafy vegetables and cutting down processed food/ carbohydrates.  Instead increasing whole grains / protein in the diet.  Multiple studies have shown that optimal weight would help improve cardiovascular risk; also shown to cut on the risk of malignancies-colon cancer, breast cancer ovarian/uterine cancer in women. Screening: March 2024- mammo- wnl; colo- prior.   #Incidental findings on Imaging  X-rays -2024: Arthritic changes; also interstitial lung changes-related to smoking.  Recommend follow-up with PCP.  I reviewed/discussed/counseled the patient.   # DISPOSITION: # follow up in 6 months. MD:  2 weeks prior-Labs CBC CMP myeloma panel kappa lambda light chain ratio -.Dr.B

## 2022-11-06 ENCOUNTER — Inpatient Hospital Stay: Payer: Medicaid Other | Attending: Internal Medicine

## 2022-11-06 DIAGNOSIS — D472 Monoclonal gammopathy: Secondary | ICD-10-CM | POA: Insufficient documentation

## 2022-11-06 LAB — CMP (CANCER CENTER ONLY)
ALT: 31 U/L (ref 0–44)
AST: 27 U/L (ref 15–41)
Albumin: 3.7 g/dL (ref 3.5–5.0)
Alkaline Phosphatase: 88 U/L (ref 38–126)
Anion gap: 8 (ref 5–15)
BUN: 13 mg/dL (ref 6–20)
CO2: 24 mmol/L (ref 22–32)
Calcium: 9 mg/dL (ref 8.9–10.3)
Chloride: 103 mmol/L (ref 98–111)
Creatinine: 0.77 mg/dL (ref 0.44–1.00)
GFR, Estimated: >60 mL/min (ref >60)
Glucose, Bld: 148 mg/dL — ABNORMAL HIGH (ref 70–99)
Potassium: 3.8 mmol/L (ref 3.5–5.1)
Sodium: 135 mmol/L (ref 135–145)
Total Bilirubin: 0.1 mg/dL — ABNORMAL LOW (ref 0.3–1.2)
Total Protein: 7.8 g/dL (ref 6.5–8.1)

## 2022-11-06 LAB — CBC WITH DIFFERENTIAL (CANCER CENTER ONLY)
Abs Immature Granulocytes: 0.02 10*3/uL (ref 0.00–0.07)
Basophils Absolute: 0.1 10*3/uL (ref 0.0–0.1)
Basophils Relative: 1 %
Eosinophils Absolute: 0.3 10*3/uL (ref 0.0–0.5)
Eosinophils Relative: 4 %
HCT: 40.8 % (ref 36.0–46.0)
Hemoglobin: 14.3 g/dL (ref 12.0–15.0)
Immature Granulocytes: 0 %
Lymphocytes Relative: 55 %
Lymphs Abs: 3.8 10*3/uL (ref 0.7–4.0)
MCH: 32.6 pg (ref 26.0–34.0)
MCHC: 35 g/dL (ref 30.0–36.0)
MCV: 92.9 fL (ref 80.0–100.0)
Monocytes Absolute: 0.4 10*3/uL (ref 0.1–1.0)
Monocytes Relative: 6 %
Neutro Abs: 2.4 10*3/uL (ref 1.7–7.7)
Neutrophils Relative %: 34 %
Platelet Count: 180 10*3/uL (ref 150–400)
RBC: 4.39 MIL/uL (ref 3.87–5.11)
RDW: 12.1 % (ref 11.5–15.5)
WBC Count: 6.9 10*3/uL (ref 4.0–10.5)
nRBC: 0 % (ref 0.0–0.2)

## 2022-11-10 LAB — MULTIPLE MYELOMA PANEL, SERUM
Albumin SerPl Elph-Mcnc: 3.7 g/dL (ref 2.9–4.4)
Albumin/Glob SerPl: 1 (ref 0.7–1.7)
Alpha 1: 0.2 g/dL (ref 0.0–0.4)
Alpha2 Glob SerPl Elph-Mcnc: 0.7 g/dL (ref 0.4–1.0)
B-Globulin SerPl Elph-Mcnc: 1.1 g/dL (ref 0.7–1.3)
Gamma Glob SerPl Elph-Mcnc: 1.8 g/dL (ref 0.4–1.8)
Globulin, Total: 3.8 g/dL (ref 2.2–3.9)
IgA: 1297 mg/dL — ABNORMAL HIGH (ref 87–352)
IgG (Immunoglobin G), Serum: 1039 mg/dL (ref 586–1602)
IgM (Immunoglobulin M), Srm: 60 mg/dL (ref 26–217)
M Protein SerPl Elph-Mcnc: 0.8 g/dL — ABNORMAL HIGH
Total Protein ELP: 7.5 g/dL (ref 6.0–8.5)

## 2022-11-10 LAB — KAPPA/LAMBDA LIGHT CHAINS
Kappa free light chain: 81.4 mg/L — ABNORMAL HIGH (ref 3.3–19.4)
Kappa, lambda light chain ratio: 4.99 — ABNORMAL HIGH (ref 0.26–1.65)
Lambda free light chains: 16.3 mg/L (ref 5.7–26.3)

## 2022-11-20 ENCOUNTER — Inpatient Hospital Stay: Payer: Medicaid Other | Attending: Internal Medicine | Admitting: Internal Medicine

## 2022-11-20 DIAGNOSIS — D472 Monoclonal gammopathy: Secondary | ICD-10-CM | POA: Diagnosis present

## 2022-11-20 DIAGNOSIS — Z87891 Personal history of nicotine dependence: Secondary | ICD-10-CM | POA: Insufficient documentation

## 2022-11-20 NOTE — Progress Notes (Signed)
No questions or concerns today.

## 2022-11-20 NOTE — Assessment & Plan Note (Addendum)
#   FEB 2024-incidental PCP]-M protein-0.7 g/dL IgA kappa; kappa lambda light chain ratio 4.5. CBC-normal hemoglobin 15; absolute lymphocyte count 4.5-slightly elevated.  Renal function calcium normal.  FEB 2024-skeletal survey showed degenerative arthritis evidence of any lytic lesions.   # AUG 2024-M protein 0.8 Kappa lambda light chain ratio normal-again suggestive of MGUS. I again reviewed with the patient and had long discussion with the patient regarding natural history of MGUS; small risk of progression to multiple myeloma.  Recommend checking labs once a year.  # History of smoking : Quit smoking smoking-February 2024-congratulated patient.   # Obesity: Recommend continued weight loss/patient started on Jardiance.  Defer to PCP for further recommendations.  # DISPOSITION: # follow up in 12  months. MD: 2 weeks prior-Labs CBC CMP myeloma panel kappa lambda light chain ratio -.Dr.B

## 2022-11-20 NOTE — Progress Notes (Signed)
[facility] CONSULT NOTE  Patient Care Team: Rayetta Humphrey, MD as PCP - General (Family Medicine) Earna Coder, MD as Consulting Physician (Oncology)  CHIEF COMPLAINTS/PURPOSE OF CONSULTATION: Monoclonal gammopathy  # FEB 2024-renal function normal calcium normal CBC normal.  Slightly elevated protein- [PCP]      SPE Total Protein 5.8 - 7.8 g/dL 8.0 High   SPE Albumin 3.50 - 5.00 g/dL 9.52  SPE Alpha 1 8.41 - 0.50 g/dL 3.24  SPE Alpha 2 4.01 - 1.10 g/dL 0.27  SPE Beta 2.53 - 6.64 g/dL 4.03  SPE Gamma 4.74 - 1.50 g/dL 2.59 High   SPE M-Spike 1 <=0.00 g/dL 5.63 High   SPE M-Spike 2       FEB 12th, 2024- [PCP]- A monoclonal gammopathy is detected with the following components: IgM-kappa in the beta/gamma region.   Oncology History   No history exists.    HISTORY OF PRESENTING ILLNESS: Patient ambulating-independently.  Alone.  Amanda Fitzgerald 62 y.o.  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   Patient quit smoking approximately 6 months ago.  She denies any worsening cough he denies emergent shortness of breath.  Denies any tingling numbness.  Review of Systems  Constitutional:  Negative for chills, diaphoresis, fever, malaise/fatigue and weight loss.  HENT:  Negative for nosebleeds and sore throat.   Eyes:  Negative for double vision.  Respiratory:  Negative for cough, hemoptysis, sputum production, shortness of breath and wheezing.   Cardiovascular:  Negative for chest pain, palpitations, orthopnea and leg swelling.  Gastrointestinal:  Negative for abdominal pain, blood in stool, constipation, diarrhea, heartburn, melena, nausea and vomiting.  Genitourinary:  Negative for dysuria, frequency and urgency.  Musculoskeletal:  Negative for back pain and joint pain.  Skin: Negative.  Negative for itching and rash.  Neurological:  Negative for dizziness, tingling, focal weakness, weakness and headaches.   Endo/Heme/Allergies:  Does not bruise/bleed easily.  Psychiatric/Behavioral:  Negative for depression. The patient is not nervous/anxious and does not have insomnia.     MEDICAL HISTORY:  Past Medical History:  Diagnosis Date   BCC (basal cell carcinoma of skin)    Hyperlipidemia    Hypertension    Hypokalemia    Ichthyosis    Insomnia    Lumbar degenerative disc disease    Prediabetes     SURGICAL HISTORY: Past Surgical History:  Procedure Laterality Date   ABDOMINAL HYSTERECTOMY     BREAST EXCISIONAL BIOPSY Right 2007   BREAST SURGERY     CESAREAN SECTION  11/2021   CHOLECYSTECTOMY      SOCIAL HISTORY: Social History   Socioeconomic History   Marital status: Married    Spouse name: Not on file   Number of children: Not on file   Years of education: Not on file   Highest education level: Not on file  Occupational History   Not on file  Tobacco Use   Smoking status: Every Day    Current packs/day: 0.25    Average packs/day: 0.3 packs/day for 45.0 years (11.3 ttl pk-yrs)    Types: Cigarettes   Smokeless tobacco: Never  Substance and Sexual Activity   Alcohol use: No   Drug use: No   Sexual activity: Not on file  Other Topics Concern   Not on file  Social History Narrative   Not on file   Social Determinants of Health   Financial Resource Strain: Low Risk  (11/07/2022)  Received from [facility]   Overall Financial Resource Strain (CARDIA)    Difficulty of Paying Living Expenses: Not hard at all  Food Insecurity: No Food Insecurity (11/07/2022)   Received from [facility]   Hunger Vital Sign    Worried About Running Out of Food in the Last Year: Never true    Ran Out of Food in the Last Year: Never true  Transportation Needs: No Transportation Needs (11/07/2022)   Received from [facility] - Transportation    In the past 12 months, has lack of transportation kept you from medical  appointments or from getting medications?: No    Lack of Transportation (Non-Medical): No  Physical Activity: Not on file  Stress: Not on file  Social Connections: Not on file  Intimate Partner Violence: Not At Risk (04/29/2022)   Humiliation, Afraid, Rape, and Kick questionnaire    Fear of Current or Ex-Partner: No    Emotionally Abused: No    Physically Abused: No    Sexually Abused: No    FAMILY HISTORY: Family History  Problem Relation Age of Onset   Melanoma Mother    Lung cancer Mother    Leukemia Brother    Breast cancer Maternal Aunt 49    ALLERGIES:  is allergic to dilaudid [hydromorphone hcl], hydromorphone, and lisinopril.  MEDICATIONS:  Current Outpatient Medications  Medication Sig Dispense Refill   amLODipine (NORVASC) 10 MG tablet Take 1 tablet by mouth daily.     cyclobenzaprine (FLEXERIL) 10 MG tablet Take 1 tablet by mouth 3 (three) times daily as needed.     empagliflozin (JARDIANCE) 10 MG TABS tablet Take 10 mg by mouth daily.     meloxicam (MOBIC) 15 MG tablet Take 15 mg by mouth daily as needed.     metoprolol tartrate (LOPRESSOR) 25 MG tablet Take 12.5 mg by mouth daily.     traZODone (DESYREL) 100 MG tablet Take 1 tablet by mouth at bedtime.     No current facility-administered medications for this visit.    PHYSICAL EXAMINATION:   Vitals:   11/20/22 1442  BP: (!) 164/72  Pulse: 87  Temp: 98.3 F (36.8 C)  SpO2: 97%    Filed Weights   11/20/22 1442  Weight: 257 lb (116.6 kg)     Physical Exam Vitals and nursing note reviewed.  HENT:     Head: Normocephalic and atraumatic.     Mouth/Throat:     Pharynx: Oropharynx is clear.  Eyes:     Extraocular Movements: Extraocular movements intact.     Pupils: Pupils are equal, round, and reactive to light.  Cardiovascular:     Rate and Rhythm: Normal rate and regular rhythm.  Pulmonary:     Comments: Decreased breath sounds bilaterally.  Abdominal:     Palpations: Abdomen is soft.   Musculoskeletal:        General: Normal range of motion.     Cervical back: Normal range of motion.  Skin:    General: Skin is warm.  Neurological:     General: No focal deficit present.     Mental Status: She is alert and oriented to person, place, and time.  Psychiatric:        Behavior: Behavior normal.        Judgment: Judgment normal.     LABORATORY DATA:  I have reviewed the data as listed Lab Results  Component Value Date   WBC 6.9 11/06/2022  HGB 14.3 11/06/2022   HCT 40.8 11/06/2022   MCV 92.9 11/06/2022   PLT 180 11/06/2022   Recent Labs    04/29/22 1446 11/06/22 0816  NA 136 135  K 3.5 3.8  CL 103 103  CO2 23 24  GLUCOSE 207* 148*  BUN 10 13  CREATININE 0.82 0.77  CALCIUM 9.1 9.0  GFRNONAA >60 >60  PROT 8.3* 7.8  ALBUMIN 3.9 3.7  AST 30 27  ALT 27 31  ALKPHOS 96 88  BILITOT 0.5 0.1*    RADIOGRAPHIC STUDIES: I have personally reviewed the radiological images as listed and agreed with the findings in the report. No results found.   Monoclonal gammopathy # FEB 2024-incidental PCP]-M protein-0.7 g/dL IgA kappa; kappa lambda light chain ratio 4.5. CBC-normal hemoglobin 15; absolute lymphocyte count 4.5-slightly elevated.  Renal function calcium normal.  FEB 2024-skeletal survey showed degenerative arthritis evidence of any lytic lesions.   # AUG 2024-M protein 0.8 Kappa lambda light chain ratio normal-again suggestive of MGUS. I again reviewed with the patient and had long discussion with the patient regarding natural history of MGUS; small risk of progression to multiple myeloma.  Recommend checking labs once a year.  # History of smoking : Quit smoking smoking-February 2024-congratulated patient.   # Obesity: Recommend continued weight loss/patient started on Jardiance.  Defer to PCP for further recommendations.  # DISPOSITION: # follow up in 12  months. MD: 2 weeks prior-Labs CBC CMP myeloma panel kappa lambda light chain ratio -.Dr.B  Above  plan of care was discussed with patient/family in detail.  My contact information was given to the patient/family.     Earna Coder, MD 11/20/2022 3:06 PM

## 2023-05-17 ENCOUNTER — Other Ambulatory Visit: Payer: Self-pay | Admitting: Family Medicine

## 2023-05-17 DIAGNOSIS — Z1231 Encounter for screening mammogram for malignant neoplasm of breast: Secondary | ICD-10-CM

## 2023-06-02 ENCOUNTER — Other Ambulatory Visit: Payer: Self-pay | Admitting: Family Medicine

## 2023-06-02 DIAGNOSIS — Z1231 Encounter for screening mammogram for malignant neoplasm of breast: Secondary | ICD-10-CM

## 2023-06-10 ENCOUNTER — Ambulatory Visit
Admission: RE | Admit: 2023-06-10 | Discharge: 2023-06-10 | Disposition: A | Discharge status: Home / Self Care | Source: Ambulatory Visit | Attending: Family Medicine | Admitting: Family Medicine

## 2023-06-10 DIAGNOSIS — Z1231 Encounter for screening mammogram for malignant neoplasm of breast: Secondary | ICD-10-CM | POA: Insufficient documentation

## 2023-11-04 ENCOUNTER — Inpatient Hospital Stay: Payer: Medicaid Other | Attending: Internal Medicine

## 2023-11-04 DIAGNOSIS — D472 Monoclonal gammopathy: Secondary | ICD-10-CM | POA: Insufficient documentation

## 2023-11-04 LAB — CBC WITH DIFFERENTIAL (CANCER CENTER ONLY)
Abs Immature Granulocytes: 0.02 K/uL (ref 0.00–0.07)
Basophils Absolute: 0.1 K/uL (ref 0.0–0.1)
Basophils Relative: 1 %
Eosinophils Absolute: 0.2 K/uL (ref 0.0–0.5)
Eosinophils Relative: 3 %
HCT: 43.7 % (ref 36.0–46.0)
Hemoglobin: 15.3 g/dL — ABNORMAL HIGH (ref 12.0–15.0)
Immature Granulocytes: 0 %
Lymphocytes Relative: 50 %
Lymphs Abs: 3.5 K/uL (ref 0.7–4.0)
MCH: 33.6 pg (ref 26.0–34.0)
MCHC: 35 g/dL (ref 30.0–36.0)
MCV: 95.8 fL (ref 80.0–100.0)
Monocytes Absolute: 0.3 K/uL (ref 0.1–1.0)
Monocytes Relative: 5 %
Neutro Abs: 2.8 K/uL (ref 1.7–7.7)
Neutrophils Relative %: 41 %
Platelet Count: 200 K/uL (ref 150–400)
RBC: 4.56 MIL/uL (ref 3.87–5.11)
RDW: 12.6 % (ref 11.5–15.5)
WBC Count: 6.9 K/uL (ref 4.0–10.5)
nRBC: 0 % (ref 0.0–0.2)

## 2023-11-04 LAB — CMP (CANCER CENTER ONLY)
ALT: 41 U/L (ref 0–44)
AST: 46 U/L — ABNORMAL HIGH (ref 15–41)
Albumin: 3.6 g/dL (ref 3.5–5.0)
Alkaline Phosphatase: 91 U/L (ref 38–126)
Anion gap: 12 (ref 5–15)
BUN: 14 mg/dL (ref 8–23)
CO2: 22 mmol/L (ref 22–32)
Calcium: 9.3 mg/dL (ref 8.9–10.3)
Chloride: 99 mmol/L (ref 98–111)
Creatinine: 1.07 mg/dL — ABNORMAL HIGH (ref 0.44–1.00)
GFR, Estimated: 59 mL/min — ABNORMAL LOW (ref >60)
Glucose, Bld: 253 mg/dL — ABNORMAL HIGH (ref 70–99)
Potassium: 3.6 mmol/L (ref 3.5–5.1)
Sodium: 133 mmol/L — ABNORMAL LOW (ref 135–145)
Total Bilirubin: 0.8 mg/dL (ref 0.0–1.2)
Total Protein: 7.8 g/dL (ref 6.5–8.1)

## 2023-11-08 LAB — MULTIPLE MYELOMA PANEL, SERUM
Albumin SerPl Elph-Mcnc: 3.4 g/dL (ref 2.9–4.4)
Albumin/Glob SerPl: 0.9 (ref 0.7–1.7)
Alpha 1: 0.2 g/dL (ref 0.0–0.4)
Alpha2 Glob SerPl Elph-Mcnc: 0.8 g/dL (ref 0.4–1.0)
B-Globulin SerPl Elph-Mcnc: 1.3 g/dL (ref 0.7–1.3)
Gamma Glob SerPl Elph-Mcnc: 1.7 g/dL (ref 0.4–1.8)
Globulin, Total: 4 g/dL — ABNORMAL HIGH (ref 2.2–3.9)
IgA: 1330 mg/dL — ABNORMAL HIGH (ref 87–352)
IgG (Immunoglobin G), Serum: 1040 mg/dL (ref 586–1602)
IgM (Immunoglobulin M), Srm: 60 mg/dL (ref 26–217)
M Protein SerPl Elph-Mcnc: 0.7 g/dL — ABNORMAL HIGH
Total Protein ELP: 7.4 g/dL (ref 6.0–8.5)

## 2023-11-09 LAB — KAPPA/LAMBDA LIGHT CHAINS
Kappa free light chain: 75.7 mg/L — ABNORMAL HIGH (ref 3.3–19.4)
Kappa, lambda light chain ratio: 4.18 — ABNORMAL HIGH (ref 0.26–1.65)
Lambda free light chains: 18.1 mg/L (ref 5.7–26.3)

## 2023-11-18 ENCOUNTER — Encounter: Payer: Self-pay | Admitting: Internal Medicine

## 2023-11-18 ENCOUNTER — Inpatient Hospital Stay: Payer: Medicaid Other | Attending: Internal Medicine | Admitting: Internal Medicine

## 2023-11-18 VITALS — BP 140/69 | HR 66 | Temp 97.6°F | Resp 12 | Ht 67.0 in | Wt 257.3 lb

## 2023-11-18 DIAGNOSIS — F17210 Nicotine dependence, cigarettes, uncomplicated: Secondary | ICD-10-CM | POA: Diagnosis not present

## 2023-11-18 DIAGNOSIS — Z801 Family history of malignant neoplasm of trachea, bronchus and lung: Secondary | ICD-10-CM | POA: Insufficient documentation

## 2023-11-18 DIAGNOSIS — Z79899 Other long term (current) drug therapy: Secondary | ICD-10-CM | POA: Insufficient documentation

## 2023-11-18 DIAGNOSIS — D472 Monoclonal gammopathy: Secondary | ICD-10-CM | POA: Insufficient documentation

## 2023-11-18 NOTE — Assessment & Plan Note (Signed)
#   FEB 2024-incidental PCP]-M protein-0.7 g/dL IgA kappa; kappa lambda light chain ratio 4.5. CBC-normal hemoglobin 15; absolute lymphocyte count 4.5-slightly elevated.  Renal function calcium normal.  FEB 2024-skeletal survey showed degenerative arthritis evidence of any lytic lesions.   # AUG 2025 M protein 0.7 Kappa lambda light chain ratio normal-again suggestive of MGUS. I again reviewed with the patient and had long discussion with the patient regarding natural history of MGUS; small risk of progression to multiple myeloma.  Recommend checking labs once a year.  # History of smoking : Quit smoking smoking-February 2024-congratulated patient.   # Elevated Blood glucose- PBF BG- 252- Obesity: Recommend continued weight loss/patient started on Jardiance.  Defer to PCP for further recommendations.  # DISPOSITION: # follow up in 12  months. MD: 2 weeks prior-Labs CBC CMP myeloma panel kappa lambda light chain ratio -.Dr.B

## 2023-11-18 NOTE — Progress Notes (Signed)
 No concerns today

## 2023-11-18 NOTE — Progress Notes (Signed)
 [facility] CONSULT NOTE  Patient Care Team: Zachary Idelia LABOR, MD as PCP - General (Family Medicine) Rennie Cindy SAUNDERS, MD as Consulting Physician (Oncology)  CHIEF COMPLAINTS/PURPOSE OF CONSULTATION: Monoclonal gammopathy  # FEB 2024-renal function normal calcium normal CBC normal.  Slightly elevated protein- [PCP]      SPE Total Protein 5.8 - 7.8 g/dL 8.0 High   SPE Albumin 3.50 - 5.00 g/dL 5.99  SPE Alpha 1 9.79 - 0.50 g/dL 9.66  SPE Alpha 2 9.49 - 1.10 g/dL 9.06  SPE Beta 9.49 - 8.79 g/dL 8.94  SPE Gamma 9.49 - 1.50 g/dL 8.29 High   SPE M-Spike 1 <=0.00 g/dL 9.27 High   SPE M-Spike 2       FEB 12th, 2024- [PCP]- A monoclonal gammopathy is detected with the following components: IgM-kappa in the beta/gamma region.   Oncology History   No history exists.    HISTORY OF PRESENTING ILLNESS: Patient ambulating-independently.  Alone.  Amanda Fitzgerald 63 y.o.  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   Patient quit smoking approximately 6 months ago.  She denies any worsening cough he denies emergent shortness of breath.  Denies any tingling numbness.  Review of Systems  Constitutional:  Negative for chills, diaphoresis, fever, malaise/fatigue and weight loss.  HENT:  Negative for nosebleeds and sore throat.   Eyes:  Negative for double vision.  Respiratory:  Negative for cough, hemoptysis, sputum production, shortness of breath and wheezing.   Cardiovascular:  Negative for chest pain, palpitations, orthopnea and leg swelling.  Gastrointestinal:  Negative for abdominal pain, blood in stool, constipation, diarrhea, heartburn, melena, nausea and vomiting.  Genitourinary:  Negative for dysuria, frequency and urgency.  Musculoskeletal:  Negative for back pain and joint pain.  Skin: Negative.  Negative for itching and rash.  Neurological:  Negative for dizziness, tingling, focal weakness, weakness and headaches.   Endo/Heme/Allergies:  Does not bruise/bleed easily.  Psychiatric/Behavioral:  Negative for depression. The patient is not nervous/anxious and does not have insomnia.     MEDICAL HISTORY:  Past Medical History:  Diagnosis Date   BCC (basal cell carcinoma of skin)    Hyperlipidemia    Hypertension    Hypokalemia    Ichthyosis    Insomnia    Lumbar degenerative disc disease    Prediabetes     SURGICAL HISTORY: Past Surgical History:  Procedure Laterality Date   ABDOMINAL HYSTERECTOMY     BREAST EXCISIONAL BIOPSY Right 2007   BREAST SURGERY     CESAREAN SECTION  11/2021   CHOLECYSTECTOMY      SOCIAL HISTORY: Social History   Socioeconomic History   Marital status: Married    Spouse name: Not on file   Number of children: Not on file   Years of education: Not on file   Highest education level: Not on file  Occupational History   Not on file  Tobacco Use   Smoking status: Every Day    Current packs/day: 0.25    Average packs/day: 0.3 packs/day for 45.0 years (11.3 ttl pk-yrs)    Types: Cigarettes   Smokeless tobacco: Never  Substance and Sexual Activity   Alcohol use: No   Drug use: No   Sexual activity: Not on file  Other Topics Concern   Not on file  Social History Narrative   Not on file   Social Drivers of Health   Financial Resource Strain: Low Risk  (11/07/2022)  Received from [facility]   Overall Financial Resource Strain (CARDIA)    Difficulty of Paying Living Expenses: Not hard at all  Food Insecurity: No Food Insecurity (11/07/2022)   Received from [facility]   Hunger Vital Sign    Within the past 12 months, you worried that your food would run out before you got the money to buy more.: Never true    Within the past 12 months, the food you bought just didn't last and you didn't have money to get more.: Never true  Transportation Needs: No Transportation Needs (11/07/2022)   Received from [facility] - Transportation    In the past 12 months, has lack of transportation kept you from medical appointments or from getting medications?: No    Lack of Transportation (Non-Medical): No  Physical Activity: Not on file  Stress: Not on file  Social Connections: Not on file  Intimate Partner Violence: Not At Risk (04/29/2022)   Humiliation, Afraid, Rape, and Kick questionnaire    Fear of Current or Ex-Partner: No    Emotionally Abused: No    Physically Abused: No    Sexually Abused: No    FAMILY HISTORY: Family History  Problem Relation Age of Onset   Melanoma Mother    Lung cancer Mother    Leukemia Brother    Breast cancer Maternal Aunt 35    ALLERGIES:  is allergic to dilaudid [hydromorphone hcl], hydromorphone, and lisinopril.  MEDICATIONS:  Current Outpatient Medications  Medication Sig Dispense Refill   amLODipine (NORVASC) 10 MG tablet Take 1 tablet by mouth daily.     empagliflozin (JARDIANCE) 10 MG TABS tablet Take 10 mg by mouth daily.     metoprolol tartrate (LOPRESSOR) 25 MG tablet Take 12.5 mg by mouth daily.     traZODone (DESYREL) 100 MG tablet Take 1 tablet by mouth at bedtime.     No current facility-administered medications for this visit.    PHYSICAL EXAMINATION:   Vitals:   11/18/23 1101  BP: (!) 140/69  Pulse: 66  Resp: 12  Temp: 97.6 F (36.4 C)  SpO2: 97%    Filed Weights   11/18/23 1101  Weight: 257 lb 4.8 oz (116.7 kg)     Physical Exam Vitals and nursing note reviewed.  HENT:     Head: Normocephalic and atraumatic.     Mouth/Throat:     Pharynx: Oropharynx is clear.  Eyes:     Extraocular Movements: Extraocular movements intact.     Pupils: Pupils are equal, round, and reactive to light.  Cardiovascular:     Rate and Rhythm: Normal rate and regular rhythm.  Pulmonary:     Comments: Decreased breath sounds bilaterally.  Abdominal:     Palpations: Abdomen is soft.  Musculoskeletal:        General: Normal range of  motion.     Cervical back: Normal range of motion.  Skin:    General: Skin is warm.  Neurological:     General: No focal deficit present.     Mental Status: She is alert and oriented to person, place, and time.  Psychiatric:        Behavior: Behavior normal.        Judgment: Judgment normal.     LABORATORY DATA:  I have reviewed the data as listed Lab Results  Component Value Date   WBC 6.9 11/04/2023   HGB 15.3 (H) 11/04/2023   HCT 43.7 11/04/2023  MCV 95.8 11/04/2023   PLT 200 11/04/2023   Recent Labs    11/04/23 1053  NA 133*  K 3.6  CL 99  CO2 22  GLUCOSE 253*  BUN 14  CREATININE 1.07*  CALCIUM 9.3  GFRNONAA 59*  PROT 7.8  ALBUMIN 3.6  AST 46*  ALT 41  ALKPHOS 91  BILITOT 0.8    RADIOGRAPHIC STUDIES: I have personally reviewed the radiological images as listed and agreed with the findings in the report. No results found.   Monoclonal gammopathy # FEB 2024-incidental PCP]-M protein-0.7 g/dL IgA kappa; kappa lambda light chain ratio 4.5. CBC-normal hemoglobin 15; absolute lymphocyte count 4.5-slightly elevated.  Renal function calcium normal.  FEB 2024-skeletal survey showed degenerative arthritis evidence of any lytic lesions.   # AUG 2025 M protein 0.7 Kappa lambda light chain ratio normal-again suggestive of MGUS. I again reviewed with the patient and had long discussion with the patient regarding natural history of MGUS; small risk of progression to multiple myeloma.  Recommend checking labs once a year.  # History of smoking : Quit smoking smoking-February 2024-congratulated patient.   # Elevated Blood glucose- PBF BG- 252- Obesity: Recommend continued weight loss/patient started on Jardiance.  Defer to PCP for further recommendations.  # DISPOSITION: # follow up in 12  months. MD: 2 weeks prior-Labs CBC CMP myeloma panel kappa lambda light chain ratio -.Dr.B   Above plan of care was discussed with patient/family in detail.  My contact information  was given to the patient/family.     Cindy JONELLE Joe, MD 11/18/2023 11:51 AM

## 2024-11-03 ENCOUNTER — Other Ambulatory Visit

## 2024-11-17 ENCOUNTER — Ambulatory Visit: Admitting: Internal Medicine
